# Patient Record
Sex: Male | Born: 1945 | Race: Black or African American | Hispanic: No | Marital: Single | Smoking: Former smoker
Health system: Southern US, Community
[De-identification: ages and names within clinical notes are randomized; demographics above are authoritative.]

## PROBLEM LIST (undated history)

## (undated) DIAGNOSIS — I739 Peripheral vascular disease, unspecified: Secondary | ICD-10-CM

## (undated) DIAGNOSIS — I10 Essential (primary) hypertension: Secondary | ICD-10-CM

## (undated) DIAGNOSIS — N183 Chronic kidney disease, stage 3 (moderate): Secondary | ICD-10-CM

## (undated) DIAGNOSIS — N200 Calculus of kidney: Secondary | ICD-10-CM

## (undated) DIAGNOSIS — M1990 Unspecified osteoarthritis, unspecified site: Secondary | ICD-10-CM

## (undated) DIAGNOSIS — N189 Chronic kidney disease, unspecified: Secondary | ICD-10-CM

## (undated) HISTORY — DX: Essential (primary) hypertension: I10

## (undated) HISTORY — DX: Unspecified osteoarthritis, unspecified site: M19.90

## (undated) HISTORY — DX: Chronic kidney disease, unspecified: N18.9

## (undated) HISTORY — DX: Peripheral vascular disease, unspecified: I73.9

## (undated) HISTORY — DX: Chronic kidney disease, stage 3 (moderate): N18.3

## (undated) HISTORY — PX: NO PAST SURGERIES: SHX2092

## (undated) HISTORY — DX: Calculus of kidney: N20.0

---

## 2013-01-29 ENCOUNTER — Ambulatory Visit: Payer: Self-pay | Admitting: Oncology

## 2013-02-14 ENCOUNTER — Inpatient Hospital Stay: Payer: Self-pay | Admitting: Internal Medicine

## 2013-02-14 LAB — CBC WITH DIFFERENTIAL/PLATELET
Basophil %: 0.2 %
Comment - H1-Com4: NORMAL
Eosinophil #: 0 10*3/uL (ref 0.0–0.7)
Eosinophil #: 0 10*3/uL (ref 0.0–0.7)
HCT: 33.4 % — ABNORMAL LOW (ref 40.0–52.0)
HCT: 28.9 % — ABNORMAL LOW
HGB: 9.4 g/dL — ABNORMAL LOW
HGB: 7.6 g/dL — ABNORMAL LOW (ref 13.0–18.0)
Lymphocyte #: 2.2 10*3/uL (ref 1.0–3.6)
Lymphocyte %: 9.1 %
Lymphocytes: 6 %
MCH: 29.7 pg (ref 26.0–34.0)
MCH: 30 pg
MCH: 30.9 pg (ref 26.0–34.0)
MCHC: 31.8 g/dL — ABNORMAL LOW (ref 32.0–36.0)
MCHC: 32.6 g/dL
MCHC: 33.6 g/dL (ref 32.0–36.0)
MCV: 94 fL (ref 80–100)
MCV: 92 fL
Monocyte #: 0.4 x10 3/mm (ref 0.2–1.0)
Monocyte #: 1.6 x10 3/mm — ABNORMAL HIGH (ref 0.2–1.0)
Monocytes: 1 %
Neutrophil #: 16.1 10*3/uL — ABNORMAL HIGH (ref 1.4–6.5)
Neutrophil #: 20.1 10*3/uL — ABNORMAL HIGH (ref 1.4–6.5)
Neutrophil %: 83.9 %
Platelet: 8 10*3/uL — CL (ref 150–440)
Platelet: 7 x10 3/mm 3 — CL
Platelet: 17 10*3/uL — CL (ref 150–440)
RBC: 3.57 10*6/uL — ABNORMAL LOW (ref 4.40–5.90)
RBC: 3.15 x10 6/mm 3 — ABNORMAL LOW
RBC: 2.47 10*6/uL — ABNORMAL LOW (ref 4.40–5.90)
RDW: 13.1 % (ref 11.5–14.5)
RDW: 13.3 %
RDW: 13.4 % (ref 11.5–14.5)
Segmented Neutrophils: 93 %
WBC: 19.6 10*3/uL — ABNORMAL HIGH (ref 3.8–10.6)
WBC: 21.4 x10 3/mm 3 — ABNORMAL HIGH
WBC: 24 10*3/uL — ABNORMAL HIGH (ref 3.8–10.6)

## 2013-02-14 LAB — URINALYSIS, COMPLETE
Bilirubin,UR: NEGATIVE
Ketone: NEGATIVE
Ph: 6 (ref 4.5–8.0)
RBC,UR: 72415 /HPF
Specific Gravity: 1.018
Squamous Epithelial: NONE SEEN
Squamous Epithelial: NONE SEEN
WBC UR: 324 /HPF
WBC UR: 106 /HPF (ref 0–5)

## 2013-02-14 LAB — COMPREHENSIVE METABOLIC PANEL
Albumin: 3.5 g/dL (ref 3.4–5.0)
Alkaline Phosphatase: 37 U/L — ABNORMAL LOW (ref 50–136)
Anion Gap: 17 — ABNORMAL HIGH (ref 7–16)
Bilirubin,Total: 0.4 mg/dL (ref 0.2–1.0)
Creatinine: 4.04 mg/dL — ABNORMAL HIGH (ref 0.60–1.30)
EGFR (African American): 17 — ABNORMAL LOW
EGFR (Non-African Amer.): 15 — ABNORMAL LOW
Glucose: 269 mg/dL — ABNORMAL HIGH (ref 65–99)
Osmolality: 310 (ref 275–301)
Potassium: 4.2 mmol/L (ref 3.5–5.1)
SGOT(AST): 18 U/L (ref 15–37)
Total Protein: 6.4 g/dL (ref 6.4–8.2)

## 2013-02-14 LAB — PROTIME-INR: INR: 1.1

## 2013-02-14 LAB — APTT: Activated PTT: 29.1 secs (ref 23.6–35.9)

## 2013-02-14 LAB — LACTATE DEHYDROGENASE: LDH: 374 U/L — ABNORMAL HIGH (ref 85–241)

## 2013-02-14 LAB — ETHANOL: Ethanol %: <0.003 % (ref 0.000–0.080)

## 2013-02-14 LAB — FIBRIN DEGRADATION PROD.(ARMC ONLY): Fibrin Degradation Prod.: <10 ug/ml (ref 2.1–7.7)

## 2013-02-14 LAB — POTASSIUM: Potassium: 5 mmol/L

## 2013-02-14 LAB — TROPONIN I: Troponin-I: 0.04 ng/mL

## 2013-02-14 LAB — LIPASE, BLOOD: Lipase: 76 U/L (ref 73–393)

## 2013-02-14 LAB — FIBRINOGEN: Fibrinogen: 343 mg/dL (ref 210–470)

## 2013-02-14 LAB — PLATELET COUNT: Platelet: 8 x10 3/mm 3 — CL

## 2013-02-14 LAB — PROTEIN / CREATININE RATIO, URINE: Protein, Random Urine: 82 mg/dL — ABNORMAL HIGH (ref 0–12)

## 2013-02-15 LAB — CBC WITH DIFFERENTIAL/PLATELET
Basophil #: 0 10*3/uL (ref 0.0–0.1)
Basophil #: 0.1 10*3/uL (ref 0.0–0.1)
Basophil #: 0 10*3/uL (ref 0.0–0.1)
Basophil %: 0.3 %
Basophil %: 0.1 %
Basophil: 1 %
Eosinophil #: 0 10*3/uL (ref 0.0–0.7)
Eosinophil %: 0 %
Eosinophil %: 0.1 %
Eosinophil %: 0 %
HCT: 21.5 % — ABNORMAL LOW (ref 40.0–52.0)
HGB: 8.2 g/dL — ABNORMAL LOW (ref 13.0–18.0)
HGB: 7.3 g/dL — ABNORMAL LOW (ref 13.0–18.0)
Lymphocyte %: 10.1 %
Lymphocyte %: 4 %
Lymphocytes: 10 %
MCH: 30.8 pg (ref 26.0–34.0)
MCH: 30.7 pg (ref 26.0–34.0)
MCV: 92 fL (ref 80–100)
MCV: 90 fL (ref 80–100)
MCV: 90 fL (ref 80–100)
Monocyte #: 1.8 x10 3/mm — ABNORMAL HIGH (ref 0.2–1.0)
Monocyte #: 1.8 x10 3/mm — ABNORMAL HIGH (ref 0.2–1.0)
Neutrophil #: 19.4 10*3/uL — ABNORMAL HIGH (ref 1.4–6.5)
Neutrophil #: 16.4 10*3/uL — ABNORMAL HIGH (ref 1.4–6.5)
Neutrophil %: 82 %
Neutrophil %: 80.2 %
Platelet: 5 10*3/uL — CL (ref 150–440)
Platelet: 8 10*3/uL — CL (ref 150–440)
Platelet: 26 10*3/uL — CL (ref 150–440)
RBC: 2.33 10*6/uL — ABNORMAL LOW (ref 4.40–5.90)
RBC: 2.66 10*6/uL — ABNORMAL LOW (ref 4.40–5.90)
RDW: 12.9 % (ref 11.5–14.5)
RDW: 13.5 % (ref 11.5–14.5)
Segmented Neutrophils: 80 %
WBC: 23.6 10*3/uL — ABNORMAL HIGH (ref 3.8–10.6)
WBC: 20.4 10*3/uL — ABNORMAL HIGH (ref 3.8–10.6)
WBC: 24 10*3/uL — ABNORMAL HIGH (ref 3.8–10.6)

## 2013-02-15 LAB — BASIC METABOLIC PANEL
Anion Gap: 7 (ref 7–16)
BUN: 75 mg/dL — ABNORMAL HIGH (ref 7–18)
Calcium, Total: 7.8 mg/dL — ABNORMAL LOW (ref 8.5–10.1)
EGFR (African American): 26 — ABNORMAL LOW
Glucose: 124 mg/dL — ABNORMAL HIGH (ref 65–99)

## 2013-02-15 LAB — URINE CULTURE

## 2013-02-16 LAB — CBC WITH DIFFERENTIAL/PLATELET
Bands: 2 %
Basophil #: 0 10*3/uL (ref 0.0–0.1)
Basophil #: 0 x10 3/mm 3 (ref 0.0–0.1)
Basophil %: 0.1 %
Basophil %: 0.1 %
Eosinophil #: 0 10*3/uL (ref 0.0–0.7)
Eosinophil #: 0 x10 3/mm 3 (ref 0.0–0.7)
Eosinophil %: 0 %
Eosinophil: 1 %
HCT: 25.1 % — ABNORMAL LOW (ref 40.0–52.0)
HGB: 7.8 g/dL — ABNORMAL LOW (ref 13.0–18.0)
HGB: 8.4 g/dL — ABNORMAL LOW (ref 13.0–18.0)
Lymphocyte #: 1.4 10*3/uL (ref 1.0–3.6)
Lymphocyte %: 4.3 %
Lymphocyte %: 5.7 %
Lymphocytes: 7 %
Lymphs Abs: 1 x10 3/mm 3 (ref 1.0–3.6)
MCH: 29.8 pg (ref 26.0–34.0)
MCHC: 33.5 g/dL (ref 32.0–36.0)
MCV: 89 fL (ref 80–100)
MCV: 89 fL (ref 80–100)
Monocyte #: 0.5 x10 3/mm (ref 0.2–1.0)
Monocyte #: 0.7 x10 3/mm (ref 0.2–1.0)
Monocyte %: 2.3 %
Monocyte %: 2.8 %
Neutrophil #: 21.7 x10 3/mm 3 — ABNORMAL HIGH (ref 1.4–6.5)
Neutrophil #: 22.2 10*3/uL — ABNORMAL HIGH (ref 1.4–6.5)
Neutrophil %: 91.4 %
Neutrophil %: 93.3 %
Platelet: 15 10*3/uL — CL (ref 150–440)
Platelet: 33 x10 3/mm 3 — ABNORMAL LOW (ref 150–440)
RBC: 2.82 x10 6/mm 3 — ABNORMAL LOW (ref 4.40–5.90)
RDW: 14.2 % (ref 11.5–14.5)
RDW: 14.5 % (ref 11.5–14.5)
Segmented Neutrophils: 90 %
WBC: 23.3 x10 3/mm 3 — ABNORMAL HIGH (ref 3.8–10.6)

## 2013-02-16 LAB — COMPREHENSIVE METABOLIC PANEL
Albumin: 3.1 g/dL — ABNORMAL LOW (ref 3.4–5.0)
Co2: 27 mmol/L (ref 21–32)
EGFR (Non-African Amer.): 32 — ABNORMAL LOW
Potassium: 4.1 mmol/L (ref 3.5–5.1)
SGOT(AST): 23 U/L (ref 15–37)
Total Protein: 6.3 g/dL — ABNORMAL LOW (ref 6.4–8.2)

## 2013-02-16 LAB — STOOL CULTURE

## 2013-02-17 LAB — BASIC METABOLIC PANEL
Anion Gap: 9 (ref 7–16)
BUN: 30 mg/dL — ABNORMAL HIGH (ref 7–18)
Calcium, Total: 7.8 mg/dL — ABNORMAL LOW (ref 8.5–10.1)
Chloride: 112 mmol/L — ABNORMAL HIGH (ref 98–107)
Co2: 25 mmol/L (ref 21–32)
Creatinine: 1.7 mg/dL — ABNORMAL HIGH (ref 0.60–1.30)
Glucose: 118 mg/dL — ABNORMAL HIGH (ref 65–99)
Osmolality: 298 (ref 275–301)
Potassium: 3.6 mmol/L (ref 3.5–5.1)
Sodium: 146 mmol/L — ABNORMAL HIGH (ref 136–145)

## 2013-02-17 LAB — CBC WITH DIFFERENTIAL/PLATELET
Basophil #: 0.1 10*3/uL (ref 0.0–0.1)
Eosinophil %: 0 %
HCT: 23.7 % — ABNORMAL LOW (ref 40.0–52.0)
HGB: 7.9 g/dL — ABNORMAL LOW (ref 13.0–18.0)
Lymphocyte %: 5 %
MCH: 29.8 pg (ref 26.0–34.0)
MCHC: 33.1 g/dL (ref 32.0–36.0)
MCV: 90 fL (ref 80–100)
Monocyte #: 1.3 x10 3/mm — ABNORMAL HIGH (ref 0.2–1.0)
Monocyte %: 4.2 %
Neutrophil #: 27.6 10*3/uL — ABNORMAL HIGH (ref 1.4–6.5)
Platelet: 58 10*3/uL — ABNORMAL LOW (ref 150–440)
RDW: 14.4 % (ref 11.5–14.5)
WBC: 30.4 10*3/uL — ABNORMAL HIGH (ref 3.8–10.6)

## 2013-02-17 LAB — VANCOMYCIN, RANDOM: Vancomycin, Random: 4 ug/mL

## 2013-02-18 LAB — BASIC METABOLIC PANEL
Anion Gap: 7 (ref 7–16)
Calcium, Total: 7.7 mg/dL — ABNORMAL LOW (ref 8.5–10.1)
Chloride: 111 mmol/L — ABNORMAL HIGH (ref 98–107)
Co2: 25 mmol/L (ref 21–32)
Creatinine: 1.47 mg/dL — ABNORMAL HIGH (ref 0.60–1.30)
EGFR (Non-African Amer.): 49 — ABNORMAL LOW
Osmolality: 292 (ref 275–301)
Sodium: 143 mmol/L (ref 136–145)

## 2013-02-18 LAB — CBC WITH DIFFERENTIAL/PLATELET
Basophil %: 0 %
Eosinophil #: 0 10*3/uL (ref 0.0–0.7)
Eosinophil %: 0 %
Lymphocyte #: 1.4 10*3/uL (ref 1.0–3.6)
Lymphocyte %: 4.7 %
MCHC: 33.2 g/dL (ref 32.0–36.0)
Monocyte #: 1.2 x10 3/mm — ABNORMAL HIGH (ref 0.2–1.0)
Monocyte %: 4.1 %
Neutrophil #: 27 10*3/uL — ABNORMAL HIGH (ref 1.4–6.5)
Neutrophil %: 91.2 %
Platelet: 118 10*3/uL — ABNORMAL LOW (ref 150–440)
RDW: 14.2 % (ref 11.5–14.5)
WBC: 29.7 10*3/uL — ABNORMAL HIGH (ref 3.8–10.6)

## 2013-02-19 LAB — CBC WITH DIFFERENTIAL/PLATELET
Lymphocytes: 5 %
MCV: 91 fL (ref 80–100)
Platelet: 186 10*3/uL (ref 150–440)
RBC: 2.76 10*6/uL — ABNORMAL LOW (ref 4.40–5.90)
RDW: 14.3 % (ref 11.5–14.5)
Segmented Neutrophils: 92 %

## 2013-02-19 LAB — BASIC METABOLIC PANEL
Anion Gap: 9 (ref 7–16)
BUN: 25 mg/dL — ABNORMAL HIGH (ref 7–18)
Calcium, Total: 7.8 mg/dL — ABNORMAL LOW (ref 8.5–10.1)
Chloride: 109 mmol/L — ABNORMAL HIGH (ref 98–107)
Co2: 24 mmol/L (ref 21–32)
EGFR (African American): 55 — ABNORMAL LOW
Glucose: 132 mg/dL — ABNORMAL HIGH (ref 65–99)
Osmolality: 289 (ref 275–301)
Potassium: 3.7 mmol/L (ref 3.5–5.1)

## 2013-02-19 LAB — CULTURE, BLOOD (SINGLE)

## 2013-02-20 LAB — STOOL CULTURE

## 2013-02-21 ENCOUNTER — Ambulatory Visit: Payer: Self-pay | Admitting: Oncology

## 2013-02-21 LAB — PROTEIN ELECTROPHORESIS(ARMC)

## 2013-02-23 LAB — CBC CANCER CENTER
Comment - H1-Com3: NORMAL
HCT: 31.3 % — ABNORMAL LOW (ref 40.0–52.0)
HGB: 10.4 g/dL — ABNORMAL LOW (ref 13.0–18.0)
Lymphocytes: 16 %
MCH: 30.9 pg (ref 26.0–34.0)
MCHC: 33.3 g/dL (ref 32.0–36.0)
MCV: 93 fL (ref 80–100)
Metamyelocyte: 2 %
Monocytes: 10 %
Myelocyte: 2 %
NRBC/100 WBC: 3 /100
RDW: 15.6 % — ABNORMAL HIGH (ref 11.5–14.5)
Segmented Neutrophils: 69 %

## 2013-02-26 ENCOUNTER — Ambulatory Visit: Payer: Self-pay | Admitting: Oncology

## 2013-03-02 LAB — CBC CANCER CENTER
Basophil #: 0.1 x10 3/mm (ref 0.0–0.1)
Basophil %: 0.5 %
Eosinophil #: 0 x10 3/mm (ref 0.0–0.7)
Eosinophil %: 0.1 %
HCT: 31.1 % — ABNORMAL LOW (ref 40.0–52.0)
Lymphocyte #: 1.1 x10 3/mm (ref 1.0–3.6)
MCH: 30.8 pg (ref 26.0–34.0)
MCHC: 32.8 g/dL (ref 32.0–36.0)
MCV: 94 fL (ref 80–100)
Monocyte #: 0.9 x10 3/mm (ref 0.2–1.0)
Monocyte %: 4.1 %
Platelet: 183 x10 3/mm (ref 150–440)
RBC: 3.31 10*6/uL — ABNORMAL LOW (ref 4.40–5.90)

## 2013-03-09 LAB — CBC CANCER CENTER
Basophil #: 0.1 x10 3/mm (ref 0.0–0.1)
Basophil %: 0.6 %
Eosinophil %: 1.1 %
HGB: 11.7 g/dL — ABNORMAL LOW (ref 13.0–18.0)
MCH: 30.5 pg (ref 26.0–34.0)
MCHC: 31.6 g/dL — ABNORMAL LOW (ref 32.0–36.0)
Neutrophil %: 64.6 %
Platelet: 216 x10 3/mm (ref 150–440)
RBC: 3.82 10*6/uL — ABNORMAL LOW (ref 4.40–5.90)
RDW: 17.3 % — ABNORMAL HIGH (ref 11.5–14.5)

## 2013-03-16 LAB — CBC CANCER CENTER
Basophil #: 0.1 x10 3/mm (ref 0.0–0.1)
Basophil %: 0.9 %
Eosinophil #: 0.1 x10 3/mm (ref 0.0–0.7)
Eosinophil %: 0.8 %
HCT: 44 % (ref 40.0–52.0)
Lymphocyte #: 4.5 x10 3/mm — ABNORMAL HIGH (ref 1.0–3.6)
Lymphocyte %: 32.4 %
MCHC: 32.1 g/dL (ref 32.0–36.0)
MCV: 95 fL (ref 80–100)
Monocyte #: 1 x10 3/mm (ref 0.2–1.0)
Monocyte %: 7.1 %
Neutrophil #: 8.2 x10 3/mm — ABNORMAL HIGH (ref 1.4–6.5)
RBC: 4.64 10*6/uL (ref 4.40–5.90)

## 2013-03-29 ENCOUNTER — Ambulatory Visit: Payer: Self-pay | Admitting: Oncology

## 2013-04-20 LAB — CBC CANCER CENTER
Basophil #: 0.1 x10 3/mm (ref 0.0–0.1)
Eosinophil #: 0.2 x10 3/mm (ref 0.0–0.7)
Eosinophil %: 2.1 %
HCT: 41.4 % (ref 40.0–52.0)
MCH: 29.7 pg (ref 26.0–34.0)
Monocyte #: 0.8 x10 3/mm (ref 0.2–1.0)
Monocyte %: 8.2 %
Neutrophil #: 4.5 x10 3/mm (ref 1.4–6.5)
Neutrophil %: 48.1 %
RBC: 4.53 10*6/uL (ref 4.40–5.90)
RDW: 15 % — ABNORMAL HIGH (ref 11.5–14.5)
WBC: 9.3 x10 3/mm (ref 3.8–10.6)

## 2013-04-28 ENCOUNTER — Ambulatory Visit: Payer: Self-pay | Admitting: Oncology

## 2013-06-01 ENCOUNTER — Ambulatory Visit: Payer: Self-pay | Admitting: Oncology

## 2013-06-01 LAB — CBC CANCER CENTER
Basophil #: 0.1 x10 3/mm (ref 0.0–0.1)
Eosinophil #: 0.2 x10 3/mm (ref 0.0–0.7)
HCT: 40.7 % (ref 40.0–52.0)
HGB: 13.5 g/dL (ref 13.0–18.0)
Lymphocyte #: 3.5 x10 3/mm (ref 1.0–3.6)
Lymphocyte %: 53.7 %
MCH: 29.4 pg (ref 26.0–34.0)
MCHC: 33.1 g/dL (ref 32.0–36.0)
Monocyte #: 0.5 x10 3/mm (ref 0.2–1.0)
Monocyte %: 7.9 %
Neutrophil %: 34.1 %
Platelet: 280 x10 3/mm (ref 150–440)
RBC: 4.59 10*6/uL (ref 4.40–5.90)
WBC: 6.5 x10 3/mm (ref 3.8–10.6)

## 2013-06-28 ENCOUNTER — Ambulatory Visit: Payer: Self-pay | Admitting: Oncology

## 2013-09-21 ENCOUNTER — Ambulatory Visit: Payer: Self-pay | Admitting: Oncology

## 2013-09-21 LAB — CBC CANCER CENTER
Basophil #: 0.1 x10 3/mm (ref 0.0–0.1)
Basophil %: 1.2 %
Eosinophil %: 2.9 %
HCT: 48.5 % (ref 40.0–52.0)
HGB: 15.9 g/dL (ref 13.0–18.0)
Lymphocyte #: 3 x10 3/mm (ref 1.0–3.6)
Lymphocyte %: 48.1 %
MCHC: 32.8 g/dL (ref 32.0–36.0)
MCV: 93 fL (ref 80–100)
Monocyte #: 0.5 x10 3/mm (ref 0.2–1.0)
Monocyte %: 7.5 %
Neutrophil #: 2.5 x10 3/mm (ref 1.4–6.5)
Neutrophil %: 40.3 %
Platelet: 244 x10 3/mm (ref 150–440)
RBC: 5.24 10*6/uL (ref 4.40–5.90)

## 2013-09-28 ENCOUNTER — Ambulatory Visit: Payer: Self-pay | Admitting: Oncology

## 2013-11-16 ENCOUNTER — Ambulatory Visit: Payer: Self-pay | Admitting: Oncology

## 2013-11-16 LAB — CBC CANCER CENTER
Basophil #: 0.1 x10 3/mm (ref 0.0–0.1)
Basophil %: 0.9 %
Eosinophil %: 2 %
HCT: 49.7 % (ref 40.0–52.0)
HGB: 16.3 g/dL (ref 13.0–18.0)
Lymphocyte #: 3 x10 3/mm (ref 1.0–3.6)
MCH: 30.5 pg (ref 26.0–34.0)
MCHC: 32.7 g/dL (ref 32.0–36.0)
MCV: 93 fL (ref 80–100)
Monocyte %: 7 %
Neutrophil #: 4.7 x10 3/mm (ref 1.4–6.5)
Platelet: 3 x10 3/mm — CL (ref 150–440)
RBC: 5.34 10*6/uL (ref 4.40–5.90)
WBC: 8.5 x10 3/mm (ref 3.8–10.6)

## 2013-11-16 LAB — FERRITIN: Ferritin (ARMC): 68 ng/mL (ref 8–388)

## 2013-11-16 LAB — IRON AND TIBC
Iron Saturation: 17 %
Iron: 60 ug/dL — ABNORMAL LOW (ref 65–175)
Unbound Iron-Bind.Cap.: 288 ug/dL

## 2013-11-17 ENCOUNTER — Inpatient Hospital Stay: Payer: Self-pay | Admitting: Oncology

## 2013-11-17 LAB — CBC CANCER CENTER
Basophil %: 0.5 %
Eosinophil #: 0 x10 3/mm (ref 0.0–0.7)
Eosinophil %: 0.1 %
HCT: 43.5 % (ref 40.0–52.0)
HGB: 14.1 g/dL (ref 13.0–18.0)
Lymphocyte %: 10.1 %
MCHC: 32.5 g/dL (ref 32.0–36.0)
Monocyte #: 0.4 x10 3/mm (ref 0.2–1.0)
Monocyte %: 2.9 %
Neutrophil #: 12.1 x10 3/mm — ABNORMAL HIGH (ref 1.4–6.5)
Platelet: 3 x10 3/mm — CL (ref 150–440)
RBC: 4.73 10*6/uL (ref 4.40–5.90)
RDW: 13.7 % (ref 11.5–14.5)
WBC: 14 x10 3/mm — ABNORMAL HIGH (ref 3.8–10.6)

## 2013-11-17 LAB — CREATININE, SERUM
Creatinine: 1.31 mg/dL — ABNORMAL HIGH (ref 0.60–1.30)
EGFR (African American): >60
EGFR (Non-African Amer.): 56 — ABNORMAL LOW

## 2013-11-17 LAB — PLATELET COUNT: Platelet: 4 10*3/uL — CL (ref 150–440)

## 2013-11-18 LAB — CBC WITH DIFFERENTIAL/PLATELET
Basophil #: 0 10*3/uL (ref 0.0–0.1)
Basophil %: 0 %
Eosinophil #: 0 10*3/uL (ref 0.0–0.7)
HCT: 37.3 % — ABNORMAL LOW (ref 40.0–52.0)
HGB: 12.6 g/dL — ABNORMAL LOW (ref 13.0–18.0)
Lymphocyte #: 0.9 10*3/uL — ABNORMAL LOW (ref 1.0–3.6)
MCHC: 33.8 g/dL (ref 32.0–36.0)
MCV: 91 fL (ref 80–100)
Monocyte #: 0.6 x10 3/mm (ref 0.2–1.0)
Neutrophil #: 20.1 10*3/uL — ABNORMAL HIGH (ref 1.4–6.5)
Neutrophil %: 92.9 %
RBC: 4.12 10*6/uL — ABNORMAL LOW (ref 4.40–5.90)
WBC: 21.6 10*3/uL — ABNORMAL HIGH (ref 3.8–10.6)

## 2013-11-18 LAB — PLATELET COUNT: Platelet: 8 10*3/uL — CL (ref 150–440)

## 2013-11-19 LAB — CBC WITH DIFFERENTIAL/PLATELET
Basophil #: 0 10*3/uL (ref 0.0–0.1)
HCT: 36.9 % — ABNORMAL LOW (ref 40.0–52.0)
HGB: 12.3 g/dL — ABNORMAL LOW (ref 13.0–18.0)
Lymphocyte #: 0.8 10*3/uL — ABNORMAL LOW (ref 1.0–3.6)
MCV: 91 fL (ref 80–100)
Monocyte #: 0.8 x10 3/mm (ref 0.2–1.0)
Platelet: 10 10*3/uL — CL (ref 150–440)

## 2013-11-20 LAB — CBC WITH DIFFERENTIAL/PLATELET
Basophil %: 0.1 %
Eosinophil %: 0 %
Lymphocyte %: 6.1 %
MCH: 30.2 pg (ref 26.0–34.0)
MCHC: 33.7 g/dL (ref 32.0–36.0)
MCV: 90 fL (ref 80–100)
Neutrophil %: 90.9 %
Platelet: 13 10*3/uL — CL (ref 150–440)
RBC: 4.64 10*6/uL (ref 4.40–5.90)
RDW: 13.4 % (ref 11.5–14.5)

## 2013-11-21 ENCOUNTER — Ambulatory Visit: Payer: Self-pay | Admitting: Oncology

## 2013-11-22 LAB — CBC CANCER CENTER
Basophil %: 0.6 %
HCT: 42.4 % (ref 40.0–52.0)
HGB: 14.1 g/dL (ref 13.0–18.0)
MCHC: 33.3 g/dL (ref 32.0–36.0)
MCV: 92 fL (ref 80–100)
RBC: 4.62 10*6/uL (ref 4.40–5.90)
WBC: 13.7 x10 3/mm — ABNORMAL HIGH (ref 3.8–10.6)

## 2013-11-28 ENCOUNTER — Ambulatory Visit: Payer: Self-pay | Admitting: Oncology

## 2013-12-07 LAB — CBC CANCER CENTER
Basophil #: 0 x10 3/mm (ref 0.0–0.1)
Basophil %: 0.2 %
Eosinophil #: 0 x10 3/mm (ref 0.0–0.7)
Eosinophil %: 0 %
HGB: 14.4 g/dL (ref 13.0–18.0)
Lymphocyte #: 0.9 x10 3/mm — ABNORMAL LOW (ref 1.0–3.6)
MCHC: 32.8 g/dL (ref 32.0–36.0)
MCV: 94 fL (ref 80–100)
Monocyte #: 0.7 x10 3/mm (ref 0.2–1.0)
Monocyte %: 3.2 %
Neutrophil #: 19.7 x10 3/mm — ABNORMAL HIGH (ref 1.4–6.5)
WBC: 21.3 x10 3/mm — ABNORMAL HIGH (ref 3.8–10.6)

## 2013-12-11 IMAGING — CR DG CHEST 1V PORT
1 series · 1 of 1 positions shown · non-contrast
Comparison: none

REASON FOR EXAM: hematemesis
COMMENTS:

PROCEDURE:     DXR - DXR PORTABLE CHEST SINGLE VIEW  - February 14, 2013  [DATE]
RESULT:     The lungs are clear. The cardiac silhouette and visualized bony
skeleton are unremarkable.

[portable]
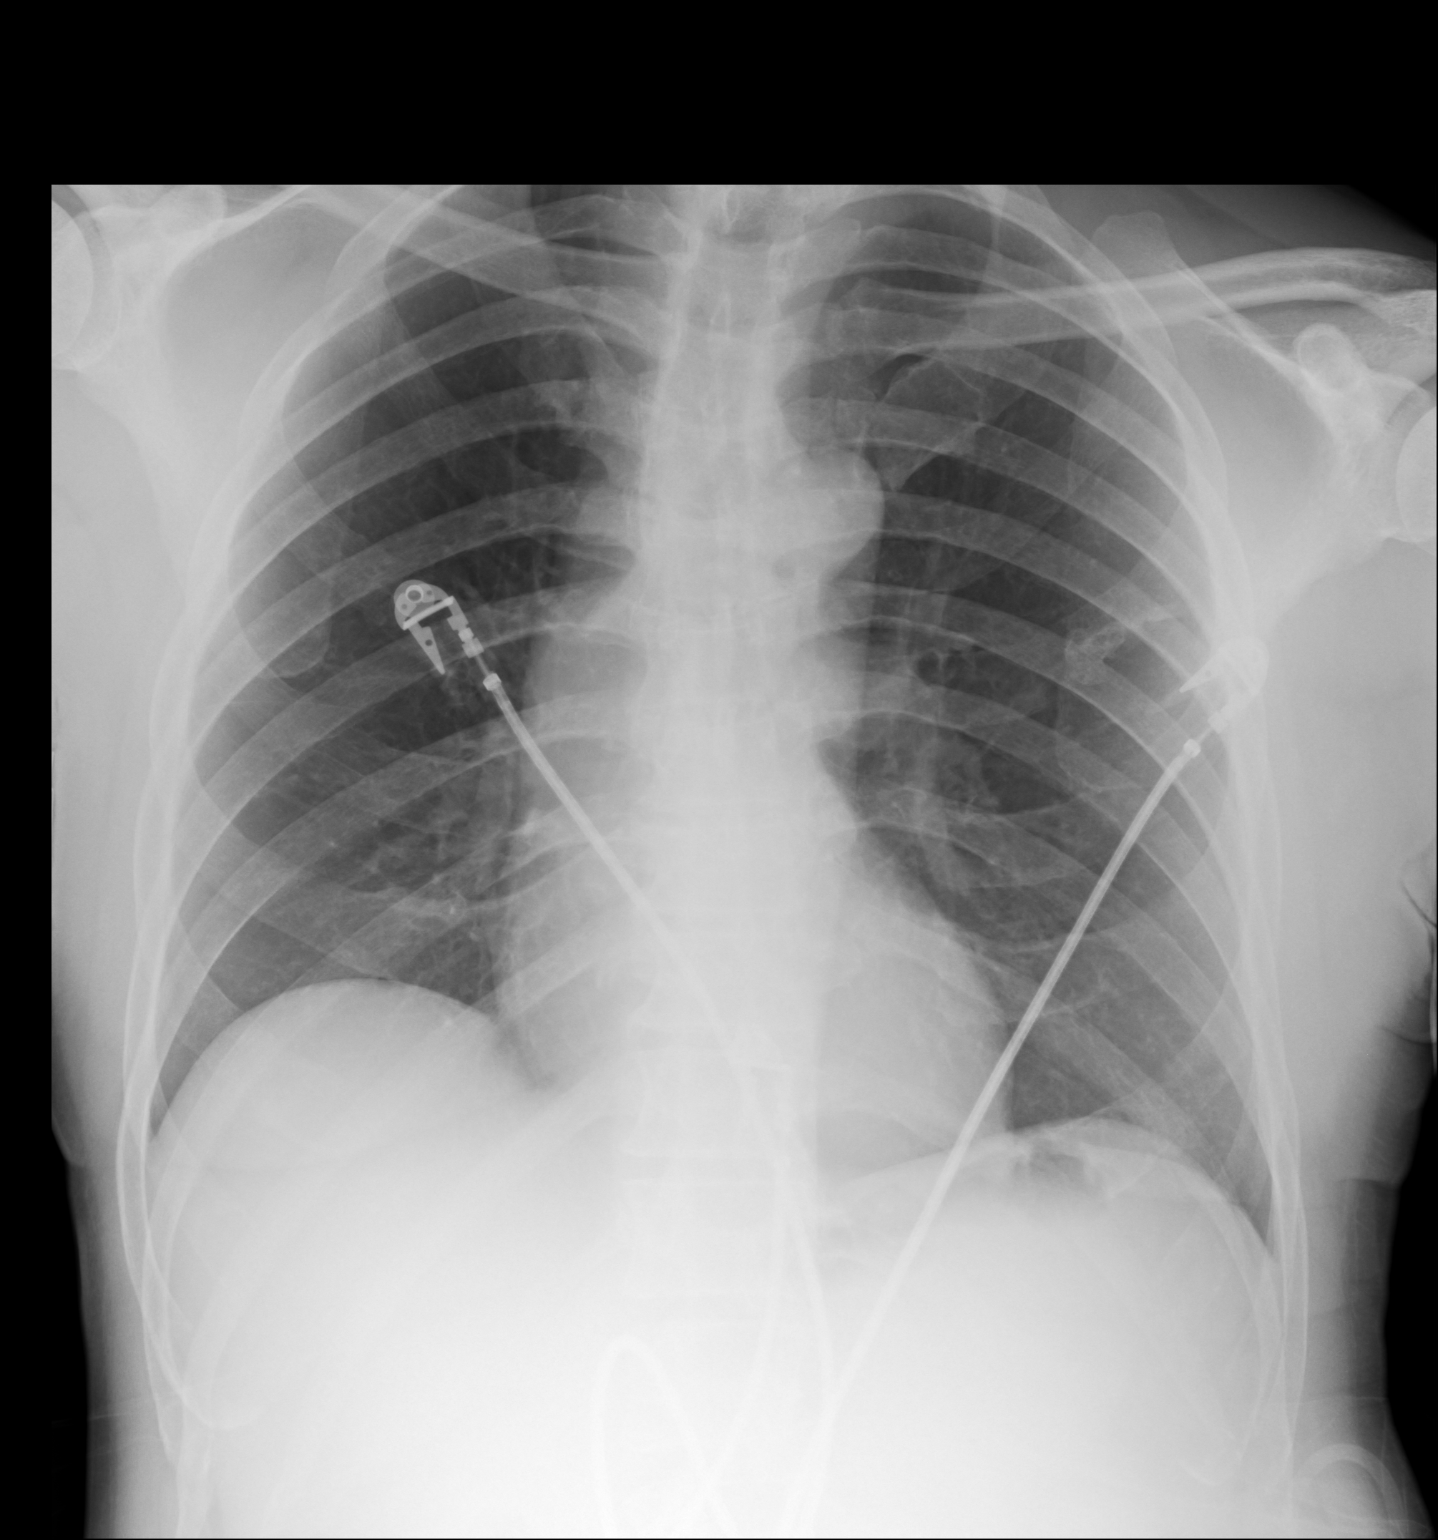

[1 of 1 positions shown; findings below may reference images not displayed]

IMPRESSION: 1. Chest radiograph without evidence of acute cardiopulmonary disease.

## 2013-12-11 IMAGING — US ABDOMEN ULTRASOUND
1 series · 13 of 25 positions shown · non-contrast
Comparison: none

REASON FOR EXAM: please evaluate bladder and prostate if possible.
COMMENTS:   May transport without cardiac monitor

[Series 1: abdomen ultrasound · 0.21mm/px · 13 of 95 slices shown]
[im 1/95]
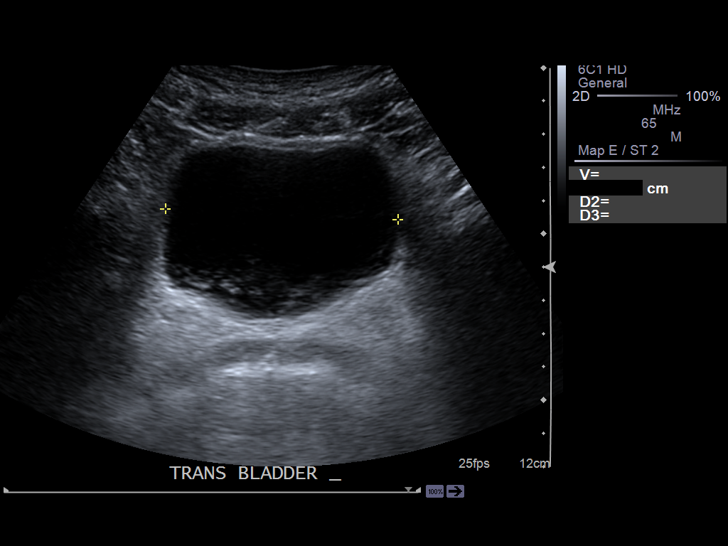
[im 8/95]
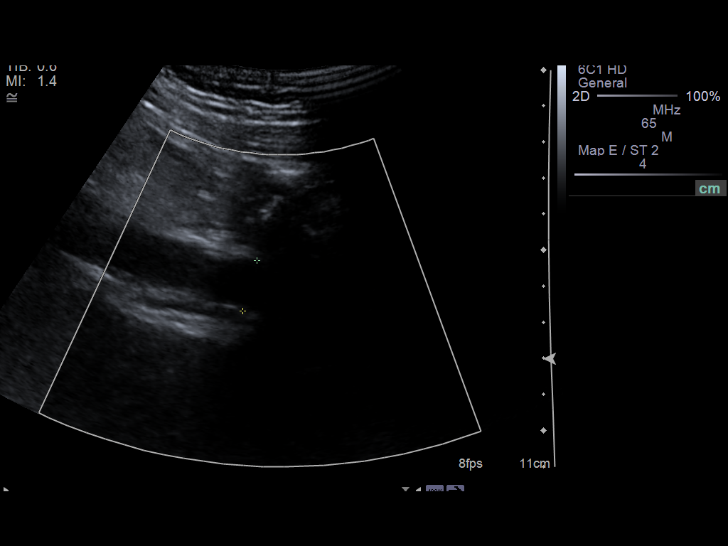
[im 16/95]
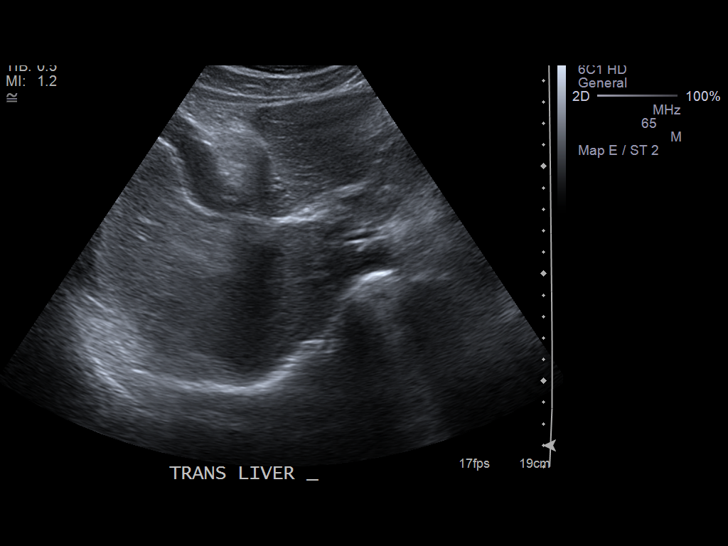
[im 24/95]
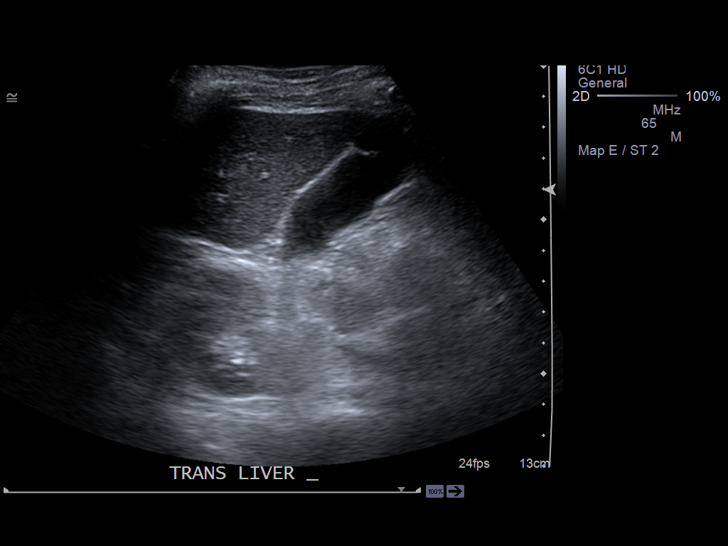
[im 32/95]
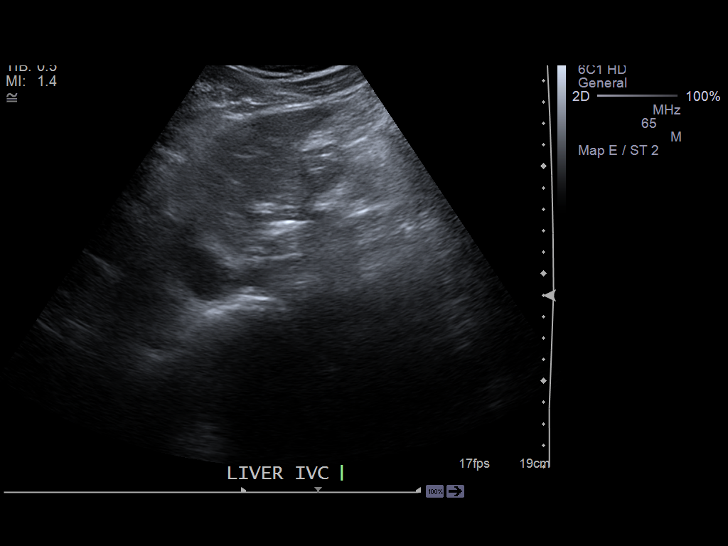
[im 40/95]
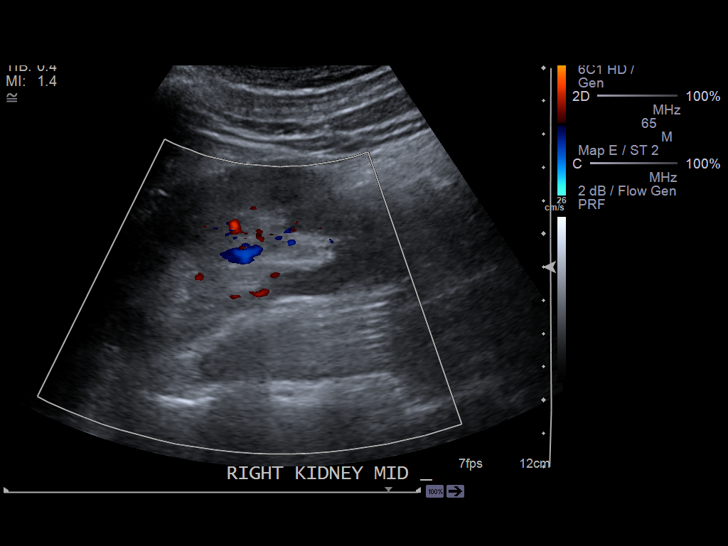
[im 48/95]
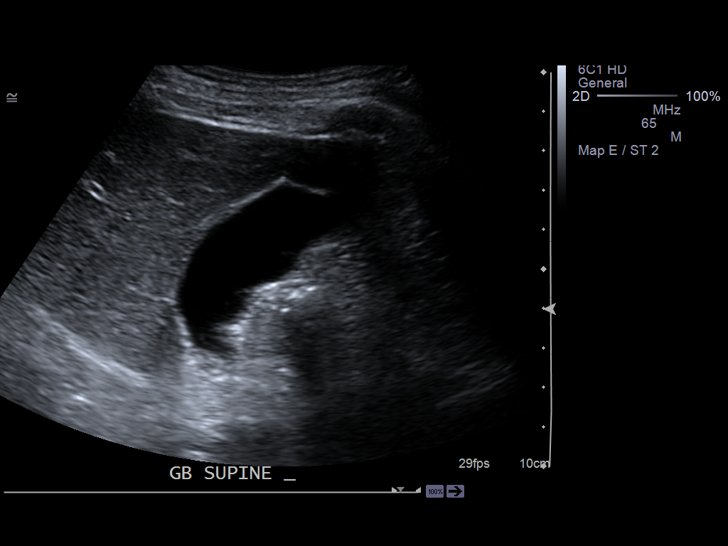
[im 55/95]
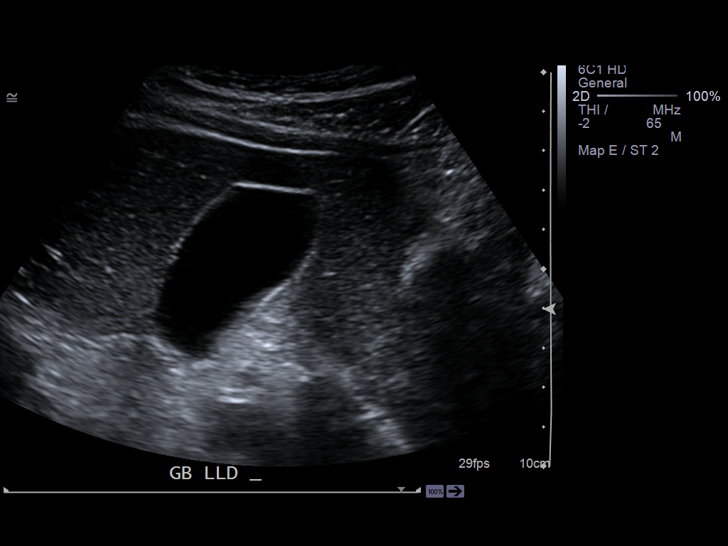
[im 63/95]
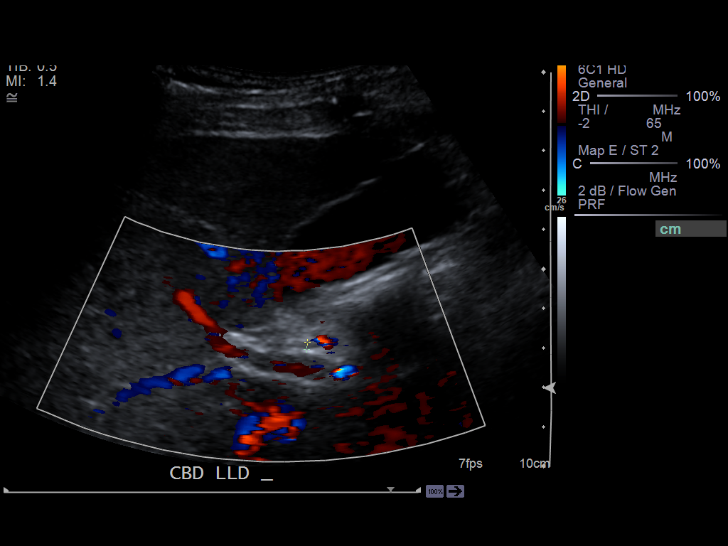
[im 71/95]
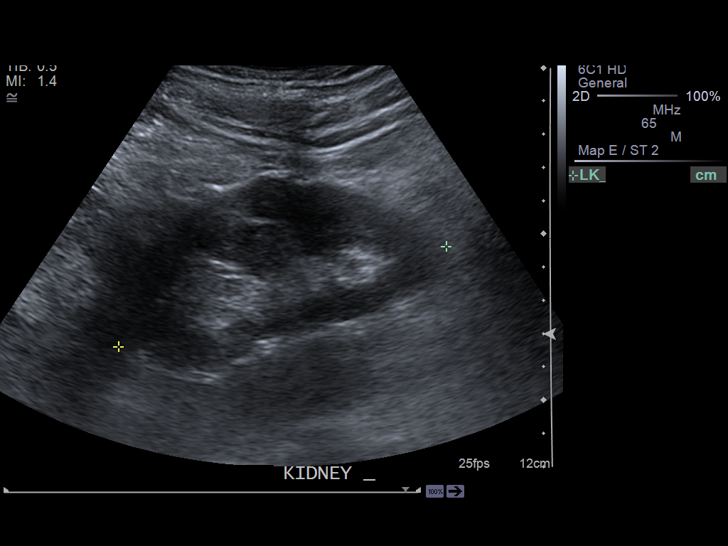
[im 79/95]
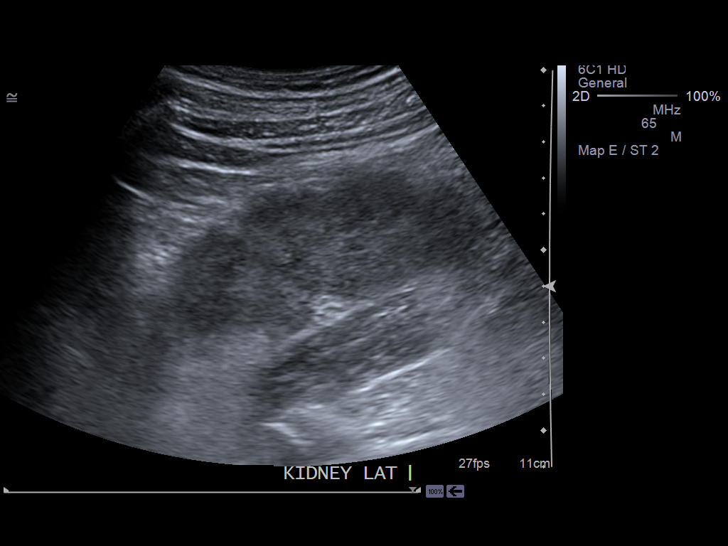
[im 87/95]
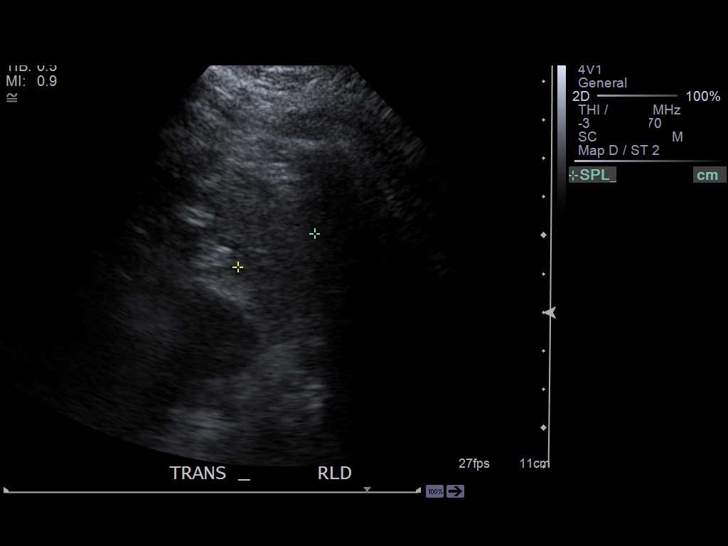
[im 95/95]
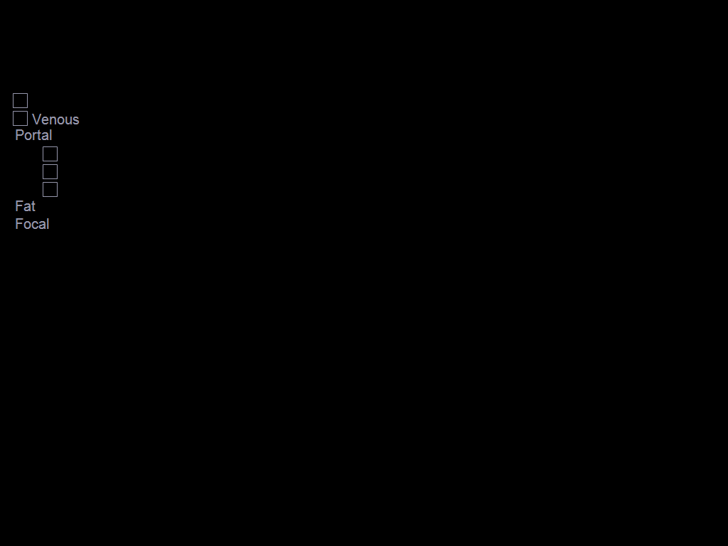

[13 of 25 positions shown; findings below may reference images not displayed]

PROCEDURE:     US  - US ABDOMEN GENERAL SURVEY  - February 14, 2013  [DATE]

RESULT:     Assessment of the urinary bladder shows a volume of 141.1 cc
with some debris layering in the dependent portion. The prostate is enlarged
measuring 6.17 x 3.70 x 5.18 cm. Abdominal aorta tapers normally. The
included pancreas appears to be unremarkable. There is no ductal dilation or
discrete mass. The portal venous flow is normal. The gallbladder shows wall
thickness of 2.0 mm. There is no evidence of cholelithiasis or acute
cholecystitis. The sonographic Murphy's sign is negative. The spleen and
kidneys are normal in size and echotexture. The right kidney measures
x 4.33 x 4.32 cm. The left kidney measures 10.45 x 5.99 x 4.18 cm. There is
a small hypoechoic lesion in the mid right kidney measuring up to 10.3 mm.
Of the inferior vena cava proximally is patent. The spleen shows a normal
echotexture.
IMPRESSION: 1. Prostatic enlargement with debris in the urinary bladder areas
2. No evidence of cholelithiasis or acute cholecystitis. Left renal cyst.
Otherwise unremarkable exam.

[REDACTED]

## 2013-12-28 LAB — CBC CANCER CENTER
Basophil %: 0.8 %
Eosinophil #: 0 x10 3/mm (ref 0.0–0.7)
Eosinophil %: 0.2 %
HCT: 50.1 % (ref 40.0–52.0)
HGB: 16.4 g/dL (ref 13.0–18.0)
Lymphocyte #: 1.9 x10 3/mm (ref 1.0–3.6)
MCV: 95 fL (ref 80–100)
Monocyte #: 0.7 x10 3/mm (ref 0.2–1.0)
Platelet: 250 x10 3/mm (ref 150–440)
RBC: 5.27 10*6/uL (ref 4.40–5.90)
RDW: 15.4 % — ABNORMAL HIGH (ref 11.5–14.5)
WBC: 13.4 x10 3/mm — ABNORMAL HIGH (ref 3.8–10.6)

## 2013-12-29 ENCOUNTER — Ambulatory Visit: Payer: Self-pay | Admitting: Oncology

## 2014-01-12 ENCOUNTER — Inpatient Hospital Stay: Payer: Self-pay | Admitting: Internal Medicine

## 2014-01-12 LAB — CBC
HCT: 47.4 % (ref 40.0–52.0)
HGB: 15.9 g/dL (ref 13.0–18.0)
MCH: 30.7 pg (ref 26.0–34.0)
MCHC: 33.6 g/dL (ref 32.0–36.0)
MCV: 91 fL (ref 80–100)
Platelet: 377 10*3/uL (ref 150–440)
RBC: 5.19 10*6/uL (ref 4.40–5.90)
RDW: 14 % (ref 11.5–14.5)
WBC: 25.2 10*3/uL — ABNORMAL HIGH (ref 3.8–10.6)

## 2014-01-12 LAB — BASIC METABOLIC PANEL
ANION GAP: 9 (ref 7–16)
BUN: 29 mg/dL — AB (ref 7–18)
CALCIUM: 9.6 mg/dL (ref 8.5–10.1)
CO2: 23 mmol/L (ref 21–32)
CREATININE: 2.33 mg/dL — AB (ref 0.60–1.30)
Chloride: 100 mmol/L (ref 98–107)
EGFR (African American): 33 — ABNORMAL LOW
GFR CALC NON AF AMER: 28 — AB
GLUCOSE: 147 mg/dL — AB (ref 65–99)
OSMOLALITY: 273 (ref 275–301)
Potassium: 4.8 mmol/L (ref 3.5–5.1)
Sodium: 132 mmol/L — ABNORMAL LOW (ref 136–145)

## 2014-01-12 LAB — TROPONIN I: Troponin-I: 0.04 ng/mL

## 2014-01-12 LAB — PRO B NATRIURETIC PEPTIDE: B-TYPE NATIURETIC PEPTID: 1027 pg/mL — AB (ref 0–125)

## 2014-01-13 LAB — RAPID INFLUENZA A&B ANTIGENS

## 2014-01-13 LAB — CBC WITH DIFFERENTIAL/PLATELET
Basophil #: 0.2 10*3/uL — ABNORMAL HIGH (ref 0.0–0.1)
Basophil %: 0.6 %
Eosinophil #: 0 10*3/uL (ref 0.0–0.7)
Eosinophil %: 0 %
HCT: 40.4 % (ref 40.0–52.0)
HGB: 13.7 g/dL (ref 13.0–18.0)
LYMPHS ABS: 2.4 10*3/uL (ref 1.0–3.6)
Lymphocyte %: 9.9 %
MCH: 31.3 pg (ref 26.0–34.0)
MCHC: 34 g/dL (ref 32.0–36.0)
MCV: 92 fL (ref 80–100)
MONO ABS: 2.6 x10 3/mm — AB (ref 0.2–1.0)
Monocyte %: 10.4 %
NEUTROS PCT: 79.1 %
Neutrophil #: 19.4 10*3/uL — ABNORMAL HIGH (ref 1.4–6.5)
PLATELETS: 320 10*3/uL (ref 150–440)
RBC: 4.38 10*6/uL — AB (ref 4.40–5.90)
RDW: 14.5 % (ref 11.5–14.5)
WBC: 24.2 10*3/uL — ABNORMAL HIGH (ref 3.8–10.6)

## 2014-01-13 LAB — BASIC METABOLIC PANEL
Anion Gap: 6 — ABNORMAL LOW (ref 7–16)
BUN: 29 mg/dL — ABNORMAL HIGH (ref 7–18)
Calcium, Total: 8.3 mg/dL — ABNORMAL LOW (ref 8.5–10.1)
Chloride: 103 mmol/L (ref 98–107)
Co2: 26 mmol/L (ref 21–32)
Creatinine: 2.04 mg/dL — ABNORMAL HIGH (ref 0.60–1.30)
GFR CALC AF AMER: 38 — AB
GFR CALC NON AF AMER: 33 — AB
Glucose: 104 mg/dL — ABNORMAL HIGH (ref 65–99)
Osmolality: 276 (ref 275–301)
Potassium: 4.5 mmol/L (ref 3.5–5.1)
Sodium: 135 mmol/L — ABNORMAL LOW (ref 136–145)

## 2014-01-13 LAB — URINALYSIS, COMPLETE
BLOOD: NEGATIVE
Bilirubin,UR: NEGATIVE
Glucose,UR: NEGATIVE mg/dL (ref 0–75)
Ketone: NEGATIVE
Nitrite: NEGATIVE
PROTEIN: NEGATIVE
Ph: 5 (ref 4.5–8.0)
Specific Gravity: 1.017 (ref 1.003–1.030)

## 2014-01-14 LAB — CBC WITH DIFFERENTIAL/PLATELET
BASOS ABS: 0.1 10*3/uL (ref 0.0–0.1)
Basophil %: 0.5 %
Eosinophil #: 0 10*3/uL (ref 0.0–0.7)
Eosinophil %: 0 %
HCT: 39.8 % — ABNORMAL LOW (ref 40.0–52.0)
HGB: 13.4 g/dL (ref 13.0–18.0)
Lymphocyte #: 2 10*3/uL (ref 1.0–3.6)
Lymphocyte %: 8.9 %
MCH: 30.8 pg (ref 26.0–34.0)
MCHC: 33.7 g/dL (ref 32.0–36.0)
MCV: 91 fL (ref 80–100)
MONO ABS: 1.7 x10 3/mm — AB (ref 0.2–1.0)
Monocyte %: 7.9 %
Neutrophil #: 18.2 10*3/uL — ABNORMAL HIGH (ref 1.4–6.5)
Neutrophil %: 82.7 %
Platelet: 349 10*3/uL (ref 150–440)
RBC: 4.35 10*6/uL — AB (ref 4.40–5.90)
RDW: 14.8 % — AB (ref 11.5–14.5)
WBC: 22 10*3/uL — AB (ref 3.8–10.6)

## 2014-01-14 LAB — BASIC METABOLIC PANEL
Anion Gap: 9 (ref 7–16)
BUN: 27 mg/dL — ABNORMAL HIGH (ref 7–18)
CREATININE: 2.19 mg/dL — AB (ref 0.60–1.30)
Calcium, Total: 8.3 mg/dL — ABNORMAL LOW (ref 8.5–10.1)
Chloride: 105 mmol/L (ref 98–107)
Co2: 24 mmol/L (ref 21–32)
EGFR (African American): 35 — ABNORMAL LOW
EGFR (Non-African Amer.): 30 — ABNORMAL LOW
Glucose: 116 mg/dL — ABNORMAL HIGH (ref 65–99)
Osmolality: 282 (ref 275–301)
POTASSIUM: 4.2 mmol/L (ref 3.5–5.1)
SODIUM: 138 mmol/L (ref 136–145)

## 2014-01-14 LAB — URINE CULTURE

## 2014-01-15 LAB — CBC WITH DIFFERENTIAL/PLATELET
BASOS ABS: 0.1 10*3/uL (ref 0.0–0.1)
BASOS PCT: 0.3 %
EOS ABS: 0 10*3/uL (ref 0.0–0.7)
Eosinophil %: 0.2 %
HCT: 39.1 % — AB (ref 40.0–52.0)
HGB: 13.2 g/dL (ref 13.0–18.0)
Lymphocyte #: 2 10*3/uL (ref 1.0–3.6)
Lymphocyte %: 11.9 %
MCH: 31.2 pg (ref 26.0–34.0)
MCHC: 33.9 g/dL (ref 32.0–36.0)
MCV: 92 fL (ref 80–100)
MONO ABS: 1.5 x10 3/mm — AB (ref 0.2–1.0)
MONOS PCT: 8.9 %
NEUTROS ABS: 13.2 10*3/uL — AB (ref 1.4–6.5)
NEUTROS PCT: 78.7 %
PLATELETS: 408 10*3/uL (ref 150–440)
RBC: 4.25 10*6/uL — ABNORMAL LOW (ref 4.40–5.90)
RDW: 14.9 % — AB (ref 11.5–14.5)
WBC: 16.8 10*3/uL — ABNORMAL HIGH (ref 3.8–10.6)

## 2014-01-15 LAB — BASIC METABOLIC PANEL
Anion Gap: 4 — ABNORMAL LOW (ref 7–16)
BUN: 17 mg/dL (ref 7–18)
CO2: 26 mmol/L (ref 21–32)
Calcium, Total: 8.4 mg/dL — ABNORMAL LOW (ref 8.5–10.1)
Chloride: 104 mmol/L (ref 98–107)
Creatinine: 1.87 mg/dL — ABNORMAL HIGH (ref 0.60–1.30)
EGFR (African American): 42 — ABNORMAL LOW
EGFR (Non-African Amer.): 37 — ABNORMAL LOW
Glucose: 112 mg/dL — ABNORMAL HIGH (ref 65–99)
Osmolality: 271 (ref 275–301)
POTASSIUM: 3.8 mmol/L (ref 3.5–5.1)
Sodium: 134 mmol/L — ABNORMAL LOW (ref 136–145)

## 2014-01-17 LAB — CULTURE, BLOOD (SINGLE)

## 2014-02-01 ENCOUNTER — Ambulatory Visit: Payer: Self-pay | Admitting: Oncology

## 2014-02-01 LAB — CBC CANCER CENTER
BASOS ABS: 0.1 x10 3/mm (ref 0.0–0.1)
Basophil %: 0.9 %
EOS PCT: 4.2 %
Eosinophil #: 0.4 x10 3/mm (ref 0.0–0.7)
HCT: 39.3 % — AB (ref 40.0–52.0)
HGB: 13 g/dL (ref 13.0–18.0)
LYMPHS ABS: 3.1 x10 3/mm (ref 1.0–3.6)
LYMPHS PCT: 31.9 %
MCH: 30.4 pg (ref 26.0–34.0)
MCHC: 33 g/dL (ref 32.0–36.0)
MCV: 92 fL (ref 80–100)
MONO ABS: 0.9 x10 3/mm (ref 0.2–1.0)
Monocyte %: 8.9 %
Neutrophil #: 5.2 x10 3/mm (ref 1.4–6.5)
Neutrophil %: 54.1 %
Platelet: 471 x10 3/mm — ABNORMAL HIGH (ref 150–440)
RBC: 4.27 10*6/uL — ABNORMAL LOW (ref 4.40–5.90)
RDW: 14.3 % (ref 11.5–14.5)
WBC: 9.7 x10 3/mm (ref 3.8–10.6)

## 2014-02-26 ENCOUNTER — Ambulatory Visit: Payer: Self-pay | Admitting: Oncology

## 2014-03-01 LAB — CBC CANCER CENTER
Basophil #: 0.2 x10 3/mm — ABNORMAL HIGH (ref 0.0–0.1)
Basophil %: 2.6 %
Eosinophil #: 0.2 x10 3/mm (ref 0.0–0.7)
Eosinophil %: 2.8 %
HCT: 43.7 % (ref 40.0–52.0)
HGB: 14.4 g/dL (ref 13.0–18.0)
LYMPHS ABS: 2.7 x10 3/mm (ref 1.0–3.6)
Lymphocyte %: 41.9 %
MCH: 30.5 pg (ref 26.0–34.0)
MCHC: 32.9 g/dL (ref 32.0–36.0)
MCV: 93 fL (ref 80–100)
MONO ABS: 0.6 x10 3/mm (ref 0.2–1.0)
Monocyte %: 9.3 %
NEUTROS PCT: 43.4 %
Neutrophil #: 2.8 x10 3/mm (ref 1.4–6.5)
Platelet: 272 x10 3/mm (ref 150–440)
RBC: 4.71 10*6/uL (ref 4.40–5.90)
RDW: 14.6 % — AB (ref 11.5–14.5)
WBC: 6.4 x10 3/mm (ref 3.8–10.6)

## 2014-03-29 ENCOUNTER — Ambulatory Visit: Payer: Self-pay | Admitting: Oncology

## 2014-06-07 ENCOUNTER — Ambulatory Visit: Payer: Self-pay | Admitting: Oncology

## 2014-11-08 IMAGING — CR DG CHEST 2V
1 series · 2 of 2 positions shown · non-contrast
Comparison: Single view of the chest 02/14/2013.

CLINICAL DATA: Severe right side pain.

EXAM:
CHEST  2 VIEW

[Series 1: w chest pa · 0.14mm/px · 2 of 2 slices shown]
[im 1/2]
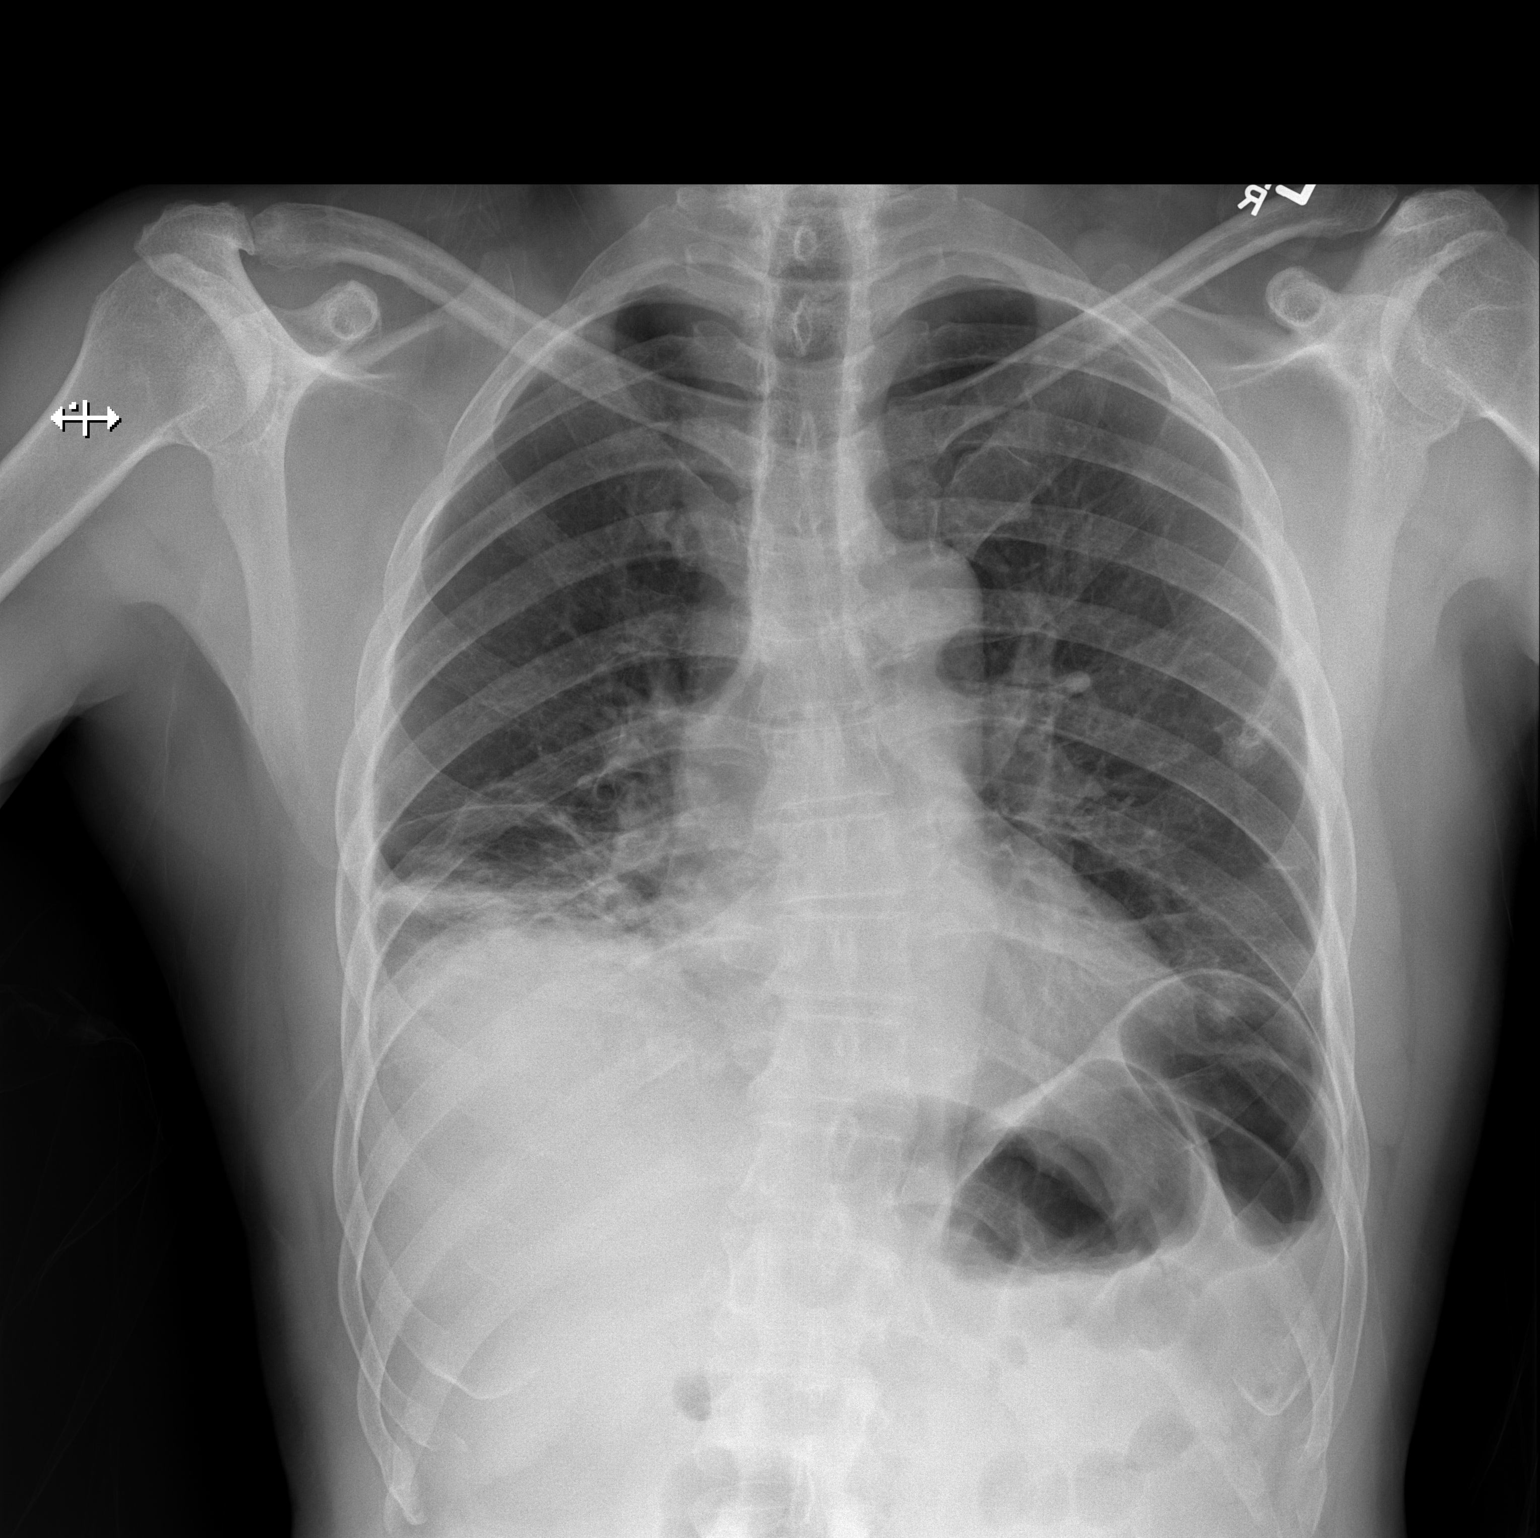
[im 2/2]
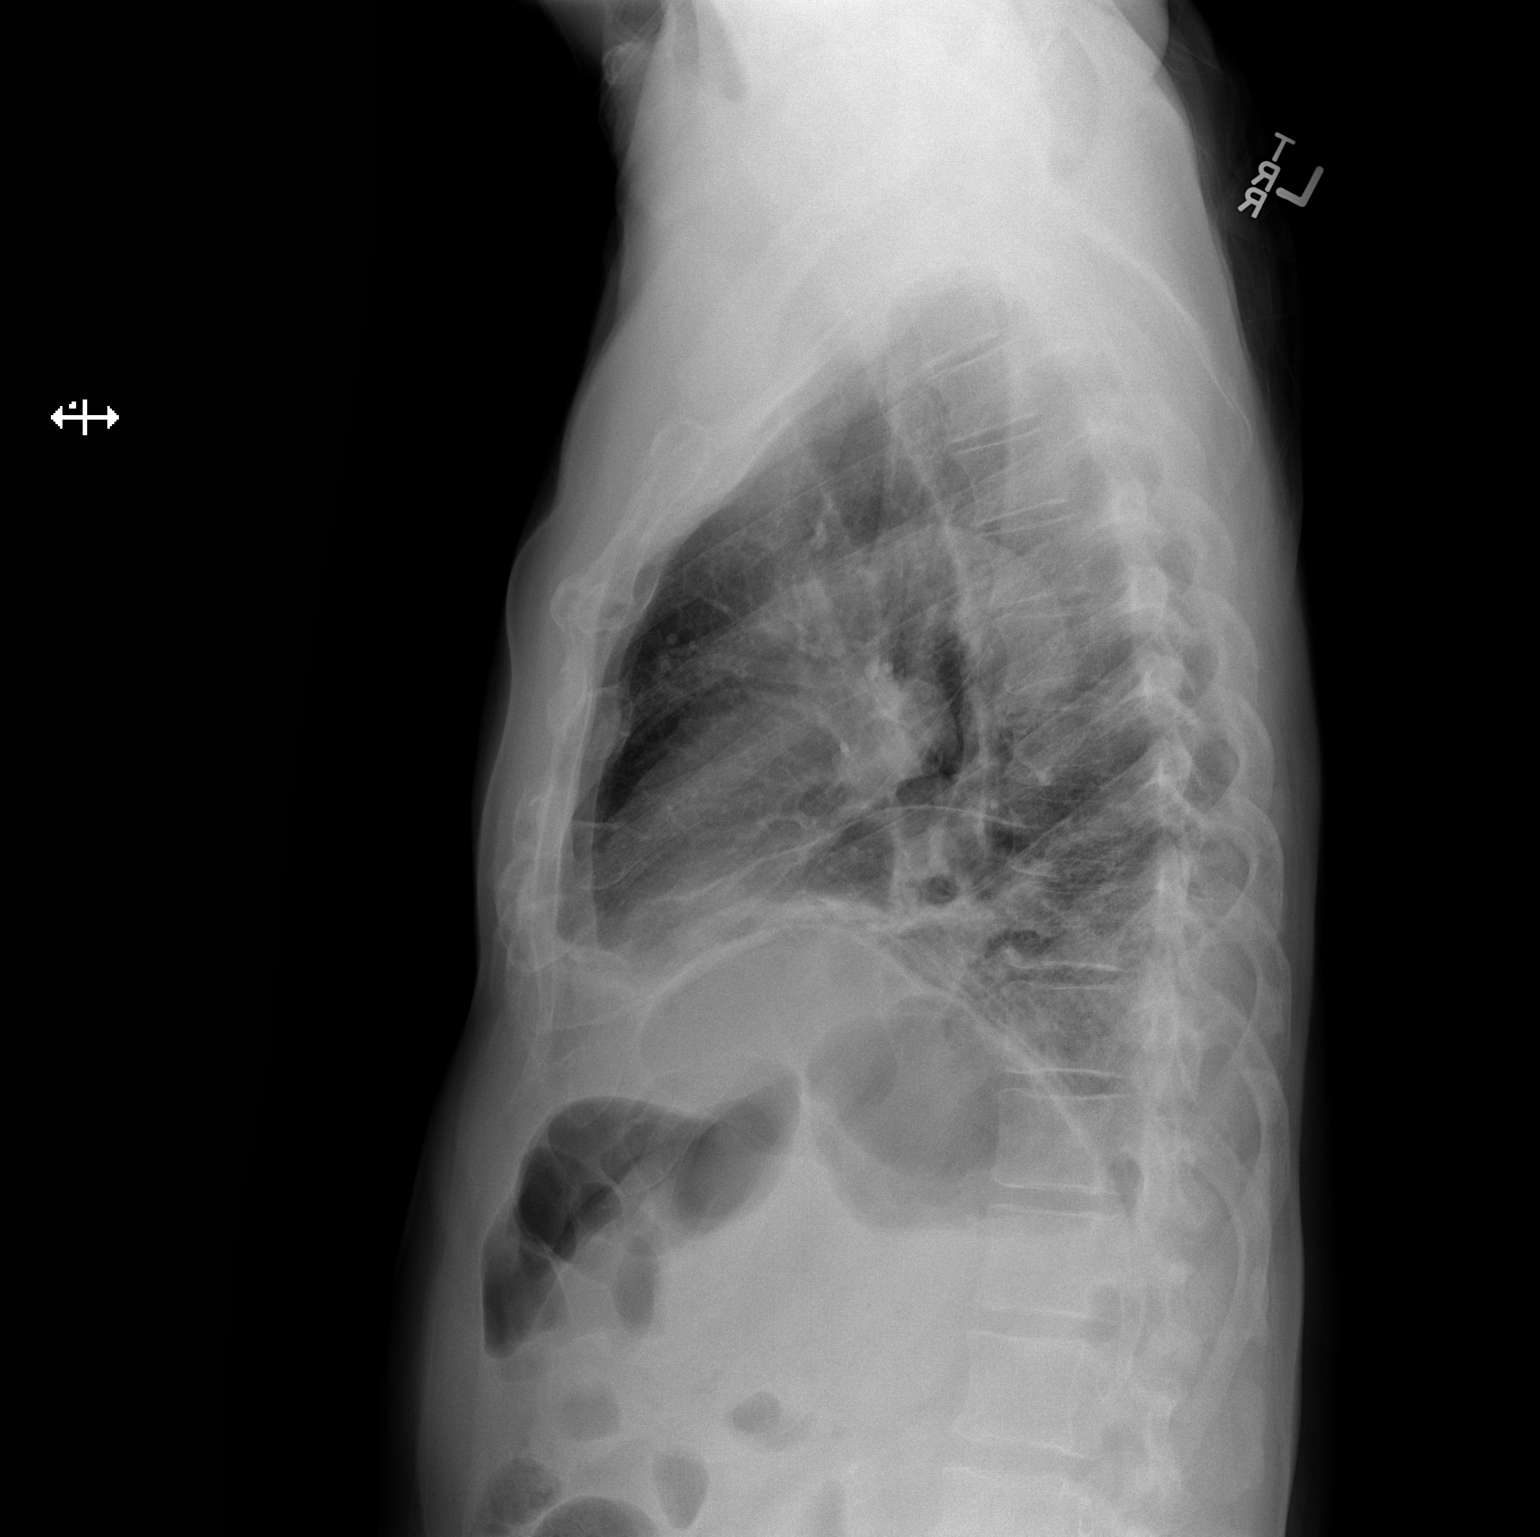

[2 of 2 positions shown; findings below may reference images not displayed]

FINDINGS: There is bibasilar airspace disease which is much worse on the
right. No pneumothorax is identified. Small right pleural effusion
is noted.
IMPRESSION: Right much worse than left basilar airspace disease most consistent
with pneumonia. Recommend followup plain films to clearing.

## 2015-04-20 NOTE — Consult Note (Signed)
PATIENT NAME:  Albert Villarreal, Albert Villarreal MR#:  [id number] DATE OF BIRTH:  07-24-1946  DATE OF CONSULTATION:  02/14/2013  REFERRING PHYSICIAN: Dr. Laurin Coder CONSULTING PHYSICIAN:  Nawal Burling Lilian Kapur, MD  REASON FOR CONSULTATION: Acute renal failure.   HISTORY OF PRESENT ILLNESS: The patient is a very pleasant 69 year old African American male with past medical history of hypertension, history of alcohol abuse, but quit alcohol 2 years ago, and tobacco abuse, who presented to [facility] with hematemesis and hematuria. Several days ago, the patient reports that he was having pain in both feet and began taking ibuprofen. He stated that he was taking ibuprofen two 200 mg tablets daily. Thereafter, he began developing some hematuria. After he developed hematuria, he began experiencing hematemesis. At that point in time, he decided to come in for evaluation. Upon presentation, the patient was found to have severe acute renal failure with a creatinine of 4.04. He denies prior history of renal insufficiency. He was also found to have severely low platelets of 8000. This was repeated and came back the same period. He also had an elevated serum LDH. I subsequently discussed the case with Dr. Grayland Ormond, who did not see evidence for schistocytes. D-dimer was a bit elevated at 3.79. Fibrinogen was found to be 343. PT and INR were found to be normal. The patient had significant amount of hematuria with RBCs of 72,415 per high-power field. He has a Foley catheter now in place and has significant blood in the Foley bag. Hemoglobin is also a bit low at 10.6. He denies having had periods of fever or confusion. Temperature currently is 98.1. Upon presentation to [facility], his temperature was 94.   PAST MEDICAL HISTORY:  1.  Hypertension.  2.  History of alcohol abuse 2 years ago.  3.  Tobacco abuse.   PAST SURGICAL HISTORY: None.   HOME MEDICATIONS:  1.  Ibuprofen 2 to 3 tablets a day for  the last 2 days.  2.  Hydrochlorothiazide/lisinopril 12.5/20 mg p.o. daily.   SOCIAL HISTORY: The patient lives alone in [place]. He smokes 2 packs per day of cigarettes. He had history of alcohol abuse in the past, but quit 2 years ago. No illicit drug use.   FAMILY HISTORY: The patient states that his mother was unfortunately burned to death. His father died of a stroke.   REVIEW OF SYSTEMS:  CONSTITUTIONAL: The patient specifically denies having had fevers. He did not have fever upon presentation here. EYES: Denies diplopia or blurry vision.  HEENT: Denies headaches, hearing loss, tinnitus or epistaxis. He did have bleeding from his gums, however.  CARDIOVASCULAR: Denies chest pain, palpitations or PND.  RESPIRATORY: Denies cough, shortness of breath or hemoptysis.  GASTROINTESTINAL: Reports hematemesis.  GENITOURINARY: Reports significant amounts of hematuria.  MUSCULOSKELETAL: Denies joint pain now, but was having significant foot pain prior to admission.  INTEGUMENTARY: Denies skin rashes or any purpura.  NEUROLOGIC: Denies focal extremity numbness, weakness or tingling.  PSYCHIATRIC: Denies depression or bipolar disorder.  ENDOCRINE: Denies polyuria, polydipsia or polyphagia.  HEMATOLOGIC/LYMPHATIC: Presented with severe thrombocytopenia. ALLERGY/IMMUNOLOGIC: Denies seasonal allergies or history of immunodeficiency.   PHYSICAL EXAMINATION:  VITAL SIGNS: Temperature 98.1, pulse 122, respirations 28, blood pressure 137/57, pulse oximetry 96% on 1 L.  GENERAL: Reveals thin African American male, who appears his stated age, currently in no acute distress.  HEENT: Normocephalic, atraumatic. Extraocular movements are intact. Pupils are equal, round, and reactive to light. No scleral icterus. The conjunctivae are pink. No  epistaxis noted. Gross hearing intact. Oral mucosa moist. There appeared to be some old remnants of mucosal bleeding within the oral cavity, but there does not appear  to be any active bleeding within the oral cavity at present.  NECK: Supple without JVD or lymphadenopathy.  LUNGS: Clear to auscultation bilaterally with normal respiratory effort.  HEART: S1, S2 noted to be slightly tachycardic. No murmurs or rubs appreciated.  ABDOMEN: Soft, nontender, nondistended. Bowel sounds positive. No rebound or guarding. No gross organomegaly appreciated.  EXTREMITIES: No clubbing, cyanosis or edema noted.  NEUROLOGIC: The patient is alert and oriented to time, person, and place. Strength is 5 out of 5 in both upper and lower extremities.  SKIN: Warm and dry. No sign of purpuric lesions noted at this time.  GENITOURINARY: Foley catheter noted to be in place. Significant amount of blood noted in the Foley tubing.  MUSCULOSKELETAL: No joint redness, swelling or tenderness appreciated.  PSYCHIATRIC: The patient with appropriate affect and appears to have good insight into his current illness.   LABORATORY DATA: Sodium 139, potassium 4.2, chloride 102, CO2 20, BUN 78, creatinine 4.04, glucose 269. LDH at 374, total protein 6.4, albumin 3.5, total bilirubin 0.4, alkaline phosphatase 37, AST 18, ALT 14. Troponin of 0.04. CBC shows WBC 19.6, hemoglobin 10.6, hematocrit 33.4, platelets of 8000. INR 1.1, PTT 29.1, PT 14.7. D-dimer 3.79. Fibrinogen 343. Urinalysis shows RBCs of 72,415 and WBC of 324.   IMPRESSION: This is a 69 year old African American male with past medical history of hypertension, history of alcohol abuse, but abstinent for 2 years, tobacco abuse, who presented to [facility] with severe thrombocytopenia, acute renal failure and mild anemia after taking ibuprofen for 3 days at home.   PROBLEM LIST:  1.  Severe acute renal failure.  2.  Thrombocytopenia, question acute.  3.  Anemia, not otherwise specified.  4.  Gastrointestinal bleeding with hematemesis.  5.  Hematuria.   PLAN: The patient presents with a very interesting case at  present. His illness began with bilateral foot pain for which he began taking ibuprofen. Thereafter, he developed hematuria, oral bleeding and hematemesis. He was found to have severe thrombocytopenia with a platelet count of 8000, mild anemia, and severe acute renal failure with a creatinine of 4. Differential diagnosis is quite extensive. We certainly considered the possibility of TTP. I discussed the case immediately with Dr. Grayland Ormond. He stated that he did not see evidence for schistocytes. Therefore, TTP is currently lower on the differential. He could still potentially have ITP or immune mediated thrombocytopenia from his ibuprofen usage. Acute renal failure could also be secondary to blood loss and subsequent acute tubular necrosis. No urgent indication for dialysis at this point in time; however, we will continue to follow renal function quite closely. The patient had an ultrasound performed, which included the right and left kidney. There was no sign of hydronephrosis on that. We will also obtain further serologic workup including urine protein to creatinine ratio, repeat urinalysis, ANCA antibodies, GBM antibodies, C3, C4, and hepatitis serologies. I also discussed the case with Dr. Laurin Coder. It appears that the patient will be given platelets shortly. The patient also will likely be undergoing bone marrow biopsy tomorrow. Further plan as the patient progresses.     ____________________________ Tama High, MD [id number] D: 02/14/2013 12:35:03 ET T: 02/14/2013 13:09:21 ET JOB#: [id number]  cc: Tama High, MD, <Dictator> Albert Villarreal Righteous Claiborne MD ELECTRONICALLY SIGNED 04/01/2013 11:29

## 2015-04-20 NOTE — Consult Note (Signed)
PATIENT NAME:  Albert Villarreal, [name] MR#:  [id number] DATE OF BIRTH:  03/20/1946  DATE OF CONSULTATION:  02/14/2013  CONSULTING PHYSICIAN:  Scot Junobert T. Bellarae Lizer, MD  REASON FOR CONSULTATION: The patient is a 69 year old black male who was admitted to the hospital because of GI bleeding. On routine labs, he was found to have a critically low platelet count of 8000, and he has a history of hematemesis of coffee-ground material and passing melena. I was asked to see him in consultation for GI bleed. When the patient presented to the ER, he was hypothermic at 94 degrees. He had significant postural change and pulse from 90 to 130.   The patient has been taking ibuprofen, maybe 3 a day, and a couple of regular aspirin for pain in his foot. He has been doing this for at least a week, maybe longer. His bleeding with melena started a couple of days ago. He had bleeding from his gums that started 2 or 3 days ago, also.  The patient has a history of alcoholism, quit drinking 2 ago. He has a history of what he thinks his gout. Other medical problems include hypertension.   HABITS: No alcohol in 2 years.  He used to drink heavily. He smokes 2 packs a day. He is sexually active with women.   MEDICATIONS: HCTZ/lisinopril 12.5/20 mg a day, ibuprofen and aspirin.   REVIEW OF SYSTEMS: No fever or chills. No nosebleeds. No asthma, wheezing, coughing or hemoptysis. No chest pain or skipping heartbeats. No dysuria or hematuria.  He has had blood in his urine since had a Foley catheter put in.  He denies any petechiae or purpura or bleeding anywhere else except for hematemesis, melena and bleeding gums No history of previous stroke.    FAMILY HISTORY:  His father died of a stroke. Mother died in a fire.   PHYSICAL EXAMINATION: GENERAL: Black male in no acute distress.  VITAL SIGNS: Temperature 98.5, pulse 108, respirations 22, blood pressure 130/70, pulse oximetry 100% with 2 liters.  HEENT: Sclerae anicteric. Conjunctivae  negative. There is no bleeding in the sclerae. The oropharynx does not show any blood. He has several remaining teeth.  NECK: Shows no masses. No thyromegaly.  CHEST: Clear in anterolateral fields.  HEART:  A 1/6 systolic murmur.  ABDOMEN: No hepatosplenomegaly. No masses. No bruits. No significant tenderness.  EXTREMITIES: No edema. No swollen or inflamed joints.  It is difficult to appreciate petechiae because he is black, but no palpable purpura noted.   LABORATORY AND RADIOLOGICAL DATA:  Total protein 6.4, albumin 3.5, total bilirubin 0.4, alkaline phosphatase 37, SGOT 18 SGPT 14, and troponin 0.04. White blood count 19.6, hemoglobin 10.6, hematocrit 33.4, platelet count 8000, MCV 94, MCH 29.7. O positive blood with a negative antibody screen. Pro time 14.7, INR 1.1. D-dimer 3.79, fibrinogen 343. Urine is bloody. He had a traumatic catheterization. The hospitalist reported the prostate was small but hard. Lipase 76, glucose 269, BUN 78, creatinine 4, sodium 139, potassium 4.2, chloride 102, CO2 20, magnesium 1.9. LDH 374. White count 21.4 on repeat. He has toxic granulations on his smear toxic, toxic vacuolization as well.   Portable chest x-ray showed no acute cardiopulmonary disease. Ultrasound of the abdomen showed prostatic enlargement with debris in the urinary bladder area. No evidence of cholelithiasis, acute cholecystitis.   ASSESSMENT:  1.  Hypothermia, severe thrombocytopenia, elevated white count, small murmur, hard enlarged prostate with some resistance to insertion of a catheter in a patient who had recently taken  ibuprofen and aspirin He appears to be septic for reasons uncertain. Given his hypothermia, his elevated white count, his  severe thrombocytopenia, I would certainly await blood cultures and urine cultures, and would start him on empiric antibiotics for possible sepsis, and get an infectious disease consult.  2.  The use of nonsteroidals and aspirin can sometimes cause  autoimmune thrombocytopenia. The duration of medication use is unknown as to how long it takes for this effect. It is possible that he has bone marrow chronic damage related to a long history of alcoholism, and we do not know what his previous platelet count was. It would be helpful if we could obtain records from his previous doctor in [place].  3.  The infusion of platelets is recommended because of his GI bleeding. Endoscopic endeavors will fail in the presence of a platelet count this low. Once his platelet count is above 50,000, we may be able to scope him if he continues to bleed, but his bleeding could be from trivial sources and may stop when his platelet count is corrected. I will follow with you. Another possibility is of widespread bone metastasis from occult malignancy, possible lymphoma, leukemia, possible widespread prostate cancer, and I agree with a hematology/oncology consultation as well as infectious disease consultation.    I have discussed the case with Dr. Mordecai Maes, and I will follow with you.   ____________________________ Scot Jun, MD rte:cb D: 02/14/2013 16:01:04 ET T: 02/14/2013 16:40:03 ET JOB#: [id number]  cc: Scot Jun, MD, <Dictator> Scot Jun MD ELECTRONICALLY SIGNED 02/17/2013 8:00

## 2015-04-20 NOTE — Consult Note (Signed)
Brief Consult Note: Diagnosis: Hematuria.   Patient was seen by consultant.   Consult note dictated.   Comments: Foley placed without problems.  Electronic Signatures: Riki AltesStoioff, Javar Eshbach C (MD)  (Signed 17-Feb-14 16:29)  Authored: Brief Consult Note   Last Updated: 17-Feb-14 16:29 by Riki AltesStoioff, Shirl Ludington C (MD)

## 2015-04-20 NOTE — Consult Note (Signed)
Pt CC thrombocytopenia, GI bleed.  Pt feeling a little better today,  chest clear, heart 1/6 SEM, abd soft and non tender.  For bone marrow today then start steroids.  On empiric antibiotics. Will  Check blood for HCV and HIV.  No plans for EGD yet.   Electronic Signatures: Scot JunElliott, Robert T (MD)  (Signed on 18-Feb-14 08:10)  Authored  Last Updated: 18-Feb-14 08:10 by Scot JunElliott, Robert T (MD)

## 2015-04-20 NOTE — Consult Note (Signed)
CC: GI bleed with severe low platelet count.  Pt platelet ct up to 50K, await CBC tomorrow morning and will do EGD if it is above 50.  Discussed with patient.    Electronic Signatures: Scot JunElliott, Robert T (MD)  (Signed on 20-Feb-14 17:51)  Authored  Last Updated: 20-Feb-14 17:51 by Scot JunElliott, Robert T (MD)

## 2015-04-20 NOTE — Consult Note (Signed)
Pt CC acute severe thrombocytopenia With GI bleed.  Hgb drifting down, some darkness to stools he thinks.  No vomiting, no abd pain.  Steroids started, plt up a little to 17K.  Exam without abd tenderness, chest clear ant fields. VSS, afebrile. Not much change.  Await increased plts to do EGD.  Await bone marrow results.  Electronic Signatures: Scot JunElliott, Georgetta Crafton T (MD)  (Signed on 19-Feb-14 15:47)  Authored  Last Updated: 19-Feb-14 15:47 by Scot JunElliott, Metta Koranda T (MD)

## 2015-04-20 NOTE — H&P (Signed)
PATIENT NAME:  Albert Villarreal, BESECKER MR#:  [id number] DATE OF BIRTH:  December 11, 1946  DATE OF ADMISSION:  02/14/2013  REFERRING PHYSICIAN:  Dr. Lurline Hare.   CHIEF COMPLAINT: Hematemesis.   HISTORY OF PRESENT ILLNESS:  This is a 69 year old male who presents first time to [facility] with complaints of coffee-ground emesis. The patient reports he has history significant only for hypertension, where he is on p.o. meds for that. Reports history of remote alcohol heavy use, with none over the last 2 years. Daughter, at bedside, and she confirmed that, as well he denies any complications such as liver disease, as well denies any esophageal varices. Upon presentation, the patient reports he has been having some left foot pain due to gout, so he was taking ibuprofen. Reports he has been taking ibuprofen for the last 3 days, on average 3 to 4 pills every day. Reports yesterday evening he started having hematemesis/coffee-ground emesis, significant amount, almost continuous over the night. As well, he reported a couple of hours after that he started to have some bright red blood per rectum, which he reported then changed to melena.  The patient's hemoglobin was 10.4, unclear what is his baseline. As well, he was noticed to have thrombocytopenia with platelets of 8000. Discussed with the lab, and they there was no clumping on the specimen there. The patient denies any history of purpura or rash or any splenomegaly or liver disease. The patient's INR was 1.1, and LFTs did not show any elevation in total bilirubin. As well, the patient was said to be in acute renal failure with a creatinine of 4.04, unclear about his baseline, but he said he never had any issues with kidney disease in the past.  Tried to insert Foley catheter in the ED, which was hard to insert, which felt some resistance and only a few millimeters came back of very bloody urine, so the balloon was not deflated and the Foley catheter was pulled  out. The patient had around 140 mL of urine volume in bladder on bladder scan. The patient was noticed to have white blood cell count of 19.6 thousand.  Chest x-ray did not show any acute infiltrate. He denies any fever,  chills, cough, productive sputum or urinary problems. As well, he was hypothermic at 94 degrees when he came. The hospitalist service was requested to admit the patient for further management and workup of his GI bleed and thrombocytopenia. The patient denies any lightheadedness or dizziness, but when he was lying supine his heart rate was in the 90s. When we did sit him up, his heart rate went up to the 130s.   PAST MEDICAL HISTORY:   1.  Hypertension.  2.  History of alcohol abuse before 2 years, nothing since then.   3.  Tobacco abuse.   PAST SURGICAL HISTORY:  None.   FAMILY HISTORY:  Denies any history of cancer, peptic ulcer disease.   SOCIAL HISTORY:  Lives at home by himself. No history of illicit drug use. Used to drink alcohol heavily before 2 years, but nothing since then. Smokes 2 packs per day.   ALLERGIES: No known drug allergies.   HOME MEDICATIONS: 1.  Hydrochlorothiazide/lisinopril 12.5/20 mg oral daily.  2.  Ibuprofen as needed, on average 3 tablets per day for the last 2 to 3 days.    REVIEW OF SYSTEMS:   CONSTITUTIONAL:  Denies any fever or chills. Complains of fatigue and weakness.  EYES: Denies blurry vision, double vision, pain, inflammation  or glaucoma.  EAR, NOSE, THROAT:  Denies tinnitus, ear pain, hearing loss, epistaxis or discharge.  RESPIRATORY:  Denies cough, wheezing, hemoptysis, dyspnea or COPD.  CARDIOVASCULAR:  Denies chest pain, edema, arrhythmia, palpitations, syncope.  GASTROINTESTINAL: Denies any nausea, abdominal pain, diarrhea, constipation. Has complaints of coffee-ground emesis/hematemesis, as well melena.  GENITOURINARY:  Denies dysuria, hematuria or colic.  ENDOCRINE:  Denies polyuria, polydipsia, heat or cold intolerance.   HEMATOLOGY:  Denies anemia, easy bruising, bleeding diathesis. INTEGUMENT:  Denies acne, rash or skin lesions.  MUSCULOSKELETAL:  Complains of some gout pain in the left foot. Denies any back pain, knee pain, hip pain, arthritis-related activity.  NEUROLOGIC:  Denies numbness, dysarthria, epilepsy, tremors, vertigo, CVA, TIA, seizures or memory loss.  PSYCHIATRIC:  Denies anxiety, schizophrenia, insomnia, nervousness, depression or bipolar disorder.   PHYSICAL EXAMINATION: VITAL SIGNS:  Temperature 94, pulse 102, respiratory rate 17, blood pressure 149/93, saturating 92% on room air. Orthostatic was not checked as patient's heart rate went from 90s  from supine to 130s while sitting, so did not proceed with further measurement.  GENERAL:  Elderly male, currently less comfortable, but in no apparent distress.  HEENT:  Head atraumatic, normocephalic. Pupils equal and reactive to light. Pink conjunctivae. Anicteric sclerae. Dry oral mucosa.  NECK:  Supple. No thyromegaly. No JVD.  CHEST:  Good air entry bilaterally. No wheezing, rales or rhonchi.  CARDIOVASCULAR:  S1, S2 heard. No rubs, murmur or gallops. Tachycardic.  ABDOMEN:  Soft, nontender, nondistended. Bowel sounds present.  EXTREMITIES:  No edema. No clubbing. No cyanosis.  PSYCHIATRIC:  Appropriate affect. Awake, alert x 3. Intact judgment and insight.  RECTAL:  The patient had significant melena, with prostate being hard and not enlarged.  NEUROLOGIC:  Cranial nerves grossly intact. Motor 5/5.  SKIN:  Normal skin turgor. Warm and dry.   PERTINENT LABORATORY DATA AND RADIOLOGIC DATA: Glucose 269, BUN 78, creatinine 4.04, sodium 139, potassium 4.2, chloride 102, CO2 of 20. ALT 14, AST 18, alk phos 37. Troponin less than 0.04. White blood cell 19.6, hemoglobin 10.6, hematocrit 33.4, platelets 8.  INR 1.1.   Chest x-ray without evidence of acute cardiopulmonary disease.   ASSESSMENT AND PLAN:  This is a 69 year old male who presents with  hematemesis and melena, most likely due to ibuprofen, hemoglobin is 10.4, unclear baseline. As well, he is in acute renal failure, thrombocytopenia with leukocytosis and orthostasis.  1.  Gastrointestinal bleed. This is most likely upper GI bleed from using NSAIDs. The patient will be admitted to CCU, was started on Protonix drip in the ED. Denies any history of peptic ulcer disease, any history of esophageal varices or gastritis.  We will continue to check H and H every 6 hours, and we will transfuse packed red blood cells if needed.  If his hemoglobin continues to drop, we will try to correct his thrombocytopenia to target for platelets more than 50,000. Ordered 2 units of platelet transfusion. Discussed with Dr. Vira Agar, who evaluated the patient today. Will be kept n.p.o., on aggressive IV fluids.  2.  Thrombocytopenia, unclear etiology, possibly sepsis versus autoimmune versus  bone marrow suppression. Consulted Dr. Grayland Ormond, who will see the patient today.  3.  Orthostasis.  This is most likely due to volume depletion from GI bleed, to less likely degree of sepsis.  We will continue with fluid resuscitation. We will hold hypertensive meds.  4.  Acute renal failure, obstructive versus dehydration. We will continue with fluids. Consulted urology for Foley insertion. CT abdomen  ordered to check on the bladder, prostate and renals and spleen and liver to see if there is any obstructive uropathy with hydronephrosis or any prostate abnormality. We will discuss with Dr. Bernardo Heater.  5.  Leukocytosis, unclear if this is stress induced from his bleed and hypotension versus sepsis. We will send urine culture, urinalysis, as well we will send blood cultures.  6.  Deep vein thrombosis prophylaxis. Sequential compression device. No chemical anticoagulation due to GI bleed.  7.  Gastrointestinal prophylaxis. The patient is on Protonix drip.   CODE STATUS: The patient is full code.   The patient's condition is  critical.   Total time spent on admission and patient care and discussion with consults and of critical care time is 70 minutes.    ____________________________ Albertine Patricia, MD dse:dm D: 02/14/2013 09:13:00 ET T: 02/14/2013 11:25:01 ET JOB#: [id number]  cc: Albertine Patricia, MD, <Dictator> Teosha Casso Graciela Husbands MD ELECTRONICALLY SIGNED 02/23/2013 4:25

## 2015-04-20 NOTE — Discharge Summary (Signed)
PATIENT NAME:  Albert Villarreal, Albert Villarreal MR#:  [id number] DATE OF BIRTH:  1946/05/10  DATE OF ADMISSION:  02/14/2013 DATE OF DISCHARGE:  02/19/2013  FINAL DIAGNOSES:  1.  Acute gastrointestinal bleed with acute hemorrhagic anemia, gastritis on EGD.  2.  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 Bone marrow biopsy still pending.  3.  Orthostatic hypotension.  4.  Hematuria.  5.  Metabolic acidosis.  6.  Acute renal failure on chronic kidney disease.  7.  Systemic inflammatory response syndrome.   MEDICATIONS ON DISCHARGE:   1.  Prednisone 20 mg 3 tablets daily until seen by Dr. Grayland Ormond and he will start a taper.  2.  Ferrous sulfate 325 mg daily.  3.  Carafate 1 gram 1 tablet twice a day.  4.  Omeprazole 20 mg twice a day.  5.  Levofloxacin 500 mg daily for 6 days.   NOTE: Stop taking hydrochlorothiazide and lisinopril.   DIET: Low-sodium diet, regular consistency.   ACTIVITY: As tolerated.   FOLLOWUP:  Follow-up with Dr. Grayland Ormond, Oncology, call Monday morning for an appointment. I did speak with Dr. Kallie Edward who will try to set up something in the office for possibly Tuesday with Dr. Grayland Ormond. Follow up with the [facility] in 1 to 2 weeks.   REASON FOR ADMISSION: The patient was admitted 02/14/2013 and discharged on 02/19/2013. Consults during the hospital stay included Dr. Vira Agar, Gastroenterology, Dr. Bernardo Heater, Urology, Dr. Holley Raring, Nephrology, and Dr. Grayland Ormond, Hematology. The patient was admitted with hematemesis.   HISTORY OF PRESENT ILLNESS: The patient is a 69 year old man who presented with coffee-grounds emesis. He has been taking some ibuprofen for left foot pain prior to admission. He was found to have severe thrombocytopenia, platelet count of 8000. The patient was also in acute renal failure with a creatinine of 4.04. The patient was admitted with gastrointestinal bleed, acute hemorrhagic anemia, initially admitted to the Critical Care  Unit, started on Protonix drip for his thrombocytopenia. Dr. Grayland Ormond was consulted for his orthostatic hypotension. He was given IV fluid resuscitation for his acute renal failure. He was given IV fluid resuscitation.   LABORATORY AND RADIOLOGICAL DATA DURING THE HOSPITAL COURSE:  Included an EKG which showed sinus tachycardia, frequent premature ventricular complexes, PT 14.7, INR 1.1, PTT 29.1. Troponin negative. Lipase 76. Ethanol level less than 3, glucose 269, BUN 78, creatinine 4.04, sodium 139, potassium 4.2, chloride 102, CO2 of 20, calcium 8.3. Liver function test negative. White blood cell count 19.6, hemoglobin and hematocrit 10.6 and 33.4, platelet count of 8. Chest x-ray showed no acute cardiopulmonary disease. It looks like patient was transfused 2 units of platelets. Repeat platelet count is 8. Ultrasound of the abdomen showed prostatic enlargement, debris in the urinary bladder, and left renal cyst. Urine culture negative. Urinalysis did have 3+ bacteria and blood. Blood culture negative. Platelet antibody was positive. Haptoglobin was low at less than 10. Fibrin degradation products less than 10. LDH elevated at 374. Looks like another platelet unit was transfused on 02/14/2013. Repeat platelet count 7. Hepatitis A, B, and C panel, hepatitis A antibody total was positive, hepatitis B surface antigen negative, hepatitis B core antibody negative, hepatitis B surface antibody nonreactive, hepatitis C antibody negative. Complement C4 normal range. C3 of 118. Anti-glomerular basement membrane 4. ANCA panel negative. ANA negative.  Stool culture negative. Eosinophils, urine none. Platelet count repeat on the 17th up to 17,000. Creatinine improved to 2.85 on the 18th. On the 18th platelet count went down to 5  again. HIV antibodies were negative. On the 18th platelet count to 8 again. On the repeat on the 18th, platelet count up to 26,000. On the 19th, platelet count up to 33,000. On the 20th, platelet  count up to 58,000. Stool culture grew out yeast, no pathogenic Escherichia coli identified. On the 20th, creatinine 1.7. On the 21st, platelet count up to 118,000. Creatinine down to 1.47. On the 22nd, platelet count up to 186. Hemoglobin was 8.3. White count 27.2, creatinine 1.52.   HOSPITAL COURSE PER PROBLEM LIST:   1.  For the patient's acute GI bleed, acute hemorrhagic anemia and gastritis on EGD, the patient was put on Omeprazole and Carafate. The patient did have an upper endoscopy by Dr. Vira Agar which showed gastritis. Most likely this is secondary to the low platelets and NSAID use. The patient was advised not to use NSAIDs.  2.  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 Grayland Ormond. The patient received platelet products while here. The patient did receive high IV dose steroids which increased the platelet count. The patient did have a bone marrow biopsy; result is still pending. During the hospital course helped to follow up as outpatient with Dr. Grayland Ormond. I did speak with Dr. Kallie Edward since the platelet count was up to 186,000. Okay to send the patient home on 60 mg of prednisone daily until seen by Dr. Grayland Ormond at that time. Repeat counts will be done and a possible prednisone taper will be done at that time. Of note, the patient does have an elevated white count and anemia. The patient was given ferrous sulfate for the anemia. White count will be followed as an outpatient and patient was given empiric vancomycin and Zosyn while here. Cultures were negative. Switched over to Levaquin for a complete course.  3.  For the patient's orthostatic hypotension likely secondary to GI bleed, was given IV fluid hydration.  4.  For the patient's hematuria, most likely this was secondary to the low platelet count. This had resolved. Can follow up as outpatient with Dr. Bernardo Heater if needed.  5.  Metabolic acidosis had improved with IV fluid hydration secondary to  the acute renal failure.  6.  Acute renal failure, now probably chronic kidney disease. The patient was given IV fluids during the hospitalization. His lisinopril hydrochlorothiazide was stopped. Recommend checking a BMP as outpatient also.  7.  For the systemic inflammatory response syndrome, cultures were negative. The patient did receive vancomycin and Zosyn while here and switched over to Levaquin for a complete course as outpatient.   TIME SPENT ON DISCHARGE:  35 minutes    ____________________________ Tana Conch. Leslye Peer, MD rjw:jr D: 02/19/2013 16:26:25 ET T: 02/20/2013 05:54:29 ET JOB#: [id number]  cc: Tana Conch. Leslye Peer, MD, <Dictator> [place] Grayland Ormond, MD  Marisue Brooklyn MD ELECTRONICALLY SIGNED 02/28/2013 18:35

## 2015-04-20 NOTE — Consult Note (Signed)
Chief Complaint:  Subjective/Chief Complaint no complaints   VITAL SIGNS/ANCILLARY NOTES: **Vital Signs.:   19-Feb-14 07:00  Vital Signs Type Routine  Pulse Pulse 68  Respirations Respirations 20  Systolic BP Systolic BP 364  Diastolic BP (mmHg) Diastolic BP (mmHg) 49  Mean BP 73  Pulse Ox % Pulse Ox % 100  Oxygen Delivery Room Air/ 21 %  Pulse Ox Heart Rate 68   Brief Assessment:  Additional Physical Exam Foley draining clear urine   Lab Results: Hepatic:  19-Feb-14 00:30   Bilirubin, Total 0.9  Alkaline Phosphatase  44  SGPT (ALT) 18  SGOT (AST) 23  Total Protein, Serum  6.3  Albumin, Serum  3.1  Routine Chem:  19-Feb-14 00:30   Glucose, Serum  142  BUN  47  Creatinine (comp)  2.10  Sodium, Serum  147  Potassium, Serum 4.1  Chloride, Serum  112  CO2, Serum 27  Calcium (Total), Serum  8.0  Osmolality (calc) 307  eGFR (African American)  37  eGFR (Non-African American)  32 (eGFR values <38m/min/1.73 m2 may be an indication of chronic kidney disease (CKD). Calculated eGFR is useful in patients with stable renal function. The eGFR calculation will not be reliable in acutely ill patients when serum creatinine is changing rapidly. It is not useful in  patients on dialysis. The eGFR calculation may not be applicable to patients at the low and high extremes of body sizes, pregnant women, and vegetarians.)  Anion Gap 8  Routine Hem:  19-Feb-14 00:30   WBC (CBC)  23.3  RBC (CBC)  2.82  Hemoglobin (CBC)  8.4  Hematocrit (CBC)  25.1  Platelet Count (CBC)  33  MCV 89  MCH 29.8  MCHC 33.5  RDW 14.2  Neutrophil % 93.3  Lymphocyte % 4.3  Monocyte % 2.3  Eosinophil % 0.0  Basophil % 0.1  Neutrophil #  21.7  Lymphocyte # 1.0  Monocyte # 0.5  Eosinophil # 0.0  Basophil # 0.0 (Result(s) reported on 16 Feb 2013 at 12:56AM.)  Bands 2  Segmented Neutrophils 90  Lymphocytes 7  Eosinophil 1  Diff Comment 1 PLTS APPEAR DECREASD  Diff Comment 2 ANISOCYTOSIS   Result(s) reported on 16 Feb 2013 at 12:56AM.  Manual Diff MANUAL DIFF DONE  Result(s) reported on 16 Feb 2013 at 02:11AM.   Assessment/Plan:  Assessment/Plan:  Assessment 1. hematuria- resolved. Etiology undetermined 2. acute renal failure- improving   Plan 1. Pt was not in urinary retention and would d/c foley when no longer needed for output monitoring 2. He will need further upper tract imaging and cystoscopy which can be sched as an outpt 3. Will sign off   Electronic Signatures: SAbbie Sons(MD)  (Signed 19-Feb-14 07:52)  Authored: Chief Complaint, VITAL SIGNS/ANCILLARY NOTES, Brief Assessment, Lab Results, Assessment/Plan   Last Updated: 19-Feb-14 07:52 by SAbbie Sons(MD)

## 2015-04-20 NOTE — Consult Note (Signed)
  DATE OF BIRTH:  1946/01/30  DATE OF CONSULTATION:  02/14/2013  REQUESTING PHYSICIAN: Dr. Randol KernElgergawy.  CONSULTING PHYSICIAN: Scott C. Stoioff, MD   REASON FOR CONSULTATION: Inability to place Foley catheter.   HISTORY OF PRESENT ILLNESS: A 69 year old male presented to the Emergency Department with hematemesis, and is admitted for an upper GI bleed. He was found to have a creatinine of 4.04. Bladder scan showed an estimated bladder volume of 140 mL. Foley catheter was unable to be inserted, and urology consultation was requested. He denies any previous history of urologic problems. He has had no voiding problems, but does state on February 13, he began to have gross hematuria. He has a significant smoking history of 2 packs per day. He is also found to have significant thrombocytopenia.   PAST MEDICAL HISTORY: 1.  Hypertension.  2.  History of alcohol abuse, though quit 2 years ago.  3.  Tobacco abuse.   PAST SURGICAL HISTORY:  None.   FAMILY HISTORY:  Noncontributory.   SOCIAL HISTORY:   A 2 pack per day smoker. No alcohol for the past 2 years.   ALLERGIES:  No known drug allergies.   MEDICATIONS AT TIME OF ADMISSION:  1.  Hydrochlorothiazide/lisinopril 12.5/20 mg daily.  2.  Ibuprofen p.r.n.   REVIEW OF SYSTEMS:    CONSTITUTIONAL: Denies fever, chills. Positive fatigue.  EYES: No visual changes.  EARS, NOSE, THROAT: No nasal congestion, tinnitus.  PULMONARY: Denies cough, wheezing, shortness of breath.   CARDIOVASCULAR: Denies chest pain, palpitations, murmur. GASTROINTESTINAL:   Denies nausea, abdominal pain. Positive hematemesis.  GENITOURINARY:  As per the HPI.  HEMATOLOGIC:  No history of bleeding or clotting disorders.  ENDOCRINE:  Denies increased thirst, heat/cold intolerance.  SKIN:  No rashes.  MUSCULOSKELETAL:  Positive degenerative joint disease.  NEUROLOGIC:  No history of CVA, seizure.  PSYCHIATRIC:  Denies anxiety, depression.   PHYSICAL  EXAMINATION: VITAL SIGNS:  Temp was 94, pulse 102, BP 149/93.  GENERAL:  The patient was in no acute distress.  HEENT:  Normocephalic, atraumatic. Conjunctivae/sclerae clear.  NECK:  Supple, without adenopathy.  CARDIOVASCULAR:  Regular rate and rhythm, without murmur.  LUNGS:  Clear to auscultation.  ABDOMEN:  Soft, nontender. Bladder was not palpable or percussible.  GENITOURINARY: Phallus circumcised, without lesions. Testes descended bilaterally, slightly atrophic. No masses.  RECTAL:  Prostate slightly firm. Estimated volume approximately 50 to 60 mL.  NEUROLOGIC:  No focal deficits.  SKIN:  Warm, dry, normal turgor.   PROCEDURE:   An 2918 French coude catheter was inserted without problems, and approximately 100 mL of dark, old-appearing, bloody urine was obtained.   DATA:  Creatinine is 4.04. WBC 19.6, platelets 8000. Ultrasound:  Prostatic enlargement, no hydronephrosis. There is a left renal cyst present.   IMPRESSIONS: 1.  Gross hematuria - etiology undetermined. The patient does have significant tobacco history. 2.  Acute renal failure.  3.  Benign prostatic hypertrophy.   RECOMMENDATION:   1.  Would recommend further evaluation with a noncontrast CT of the abdomen and pelvis.  2.  He will eventually need cystoscopy and possible bilateral retrograde pyelograms for lower tract evaluation if his creatinine does not return to normal.  3.  Irrigate catheter as needed.  4.  Will follow.     ____________________________ Verna CzechScott C. Lonna CobbStoioff, MD scs:mr D: 02/14/2013 16:28:00 ET T: 02/14/2013 20:14:15 ET JOB#: [id number]  cc: Lorin PicketScott C. Lonna CobbStoioff, MD, <Dictator> Riki AltesSCOTT C STOIOFF MD ELECTRONICALLY SIGNED 02/16/2013 8:04

## 2015-04-20 NOTE — Consult Note (Signed)
History of Present Illness:  Reason for Consult Severe pancytopenia with bleeding.   HPI   Patient is a 69 year old male who presented to the emergency room with approximately 1 days worth of hematemesis/ coffee-ground emesis. He does admit to taking ibuprofen for the last several days for a gout flare in his foot.  He also noted hematuria.  He otherwise has felt well and at his baseline.  He has no neurologic complaints.  He denies any recent fevers or illnesses.  He is a good appetite and denies weight loss.  He has no night sweats.  He denies any chest pain or shortness of breath.  He denies any nausea, constipation, or diarrhea.  He has no other urinary complaints.  Patient otherwise feels well and offers no further specific complaints  PFSH:  Additional Past Medical and Surgical History hypertension, heavy alcohol use.  Family history: Negative and noncontributory.  Social history: Previous heavy alcohol, none for 2 years.  Positive tobacco, patient continues to smoke, over 2 packs per day.   Review of Systems:  Performance Status (ECOG) 0   Review of Systems   As per HPI. Otherwise, 10 point system review was negative.   NURSING NOTES: **Vital Signs.:   17-Feb-14 17:00   Vital Signs Type: Blood Transfusion Complete   Temperature Temperature (F): 98.5   Celsius: 36.9   Temperature Source: oral   Pulse Pulse: 106   Respirations Respirations: 25   Systolic BP Systolic BP: 700   Diastolic BP (mmHg) Diastolic BP (mmHg): 42   Mean BP: 80   Pulse Ox % Pulse Ox %: 51   Oxygen Delivery: 2L; Nasal Cannula   Physical Exam:  Physical Exam General: thin, no acute distress. Eyes: Pink conjunctiva, anicteric sclera. HEENT: Normocephalic, moist mucous membranes, clear oropharnyx. Lungs: Clear to auscultation bilaterally. Heart: Regular rate and rhythm. No rubs, murmurs, or gallops. Abdomen: Soft, nontender, nondistended. No organomegaly noted, normoactive bowel  sounds. Musculoskeletal: No edema, cyanosis, or clubbing. Neuro: Alert, answering all questions appropriately. Cranial nerves grossly intact. Skin: No rashes or petechiae noted. Psych: Normal affect. Lymphatics: No cervical, calvicular, axillary or inguinal LAD.    No Known Allergies:     hydrochlorothiazide-lisinopril 12.5 mg-20 mg oral tablet: 1 tab(s) orally once a day, Status: Active, Quantity: 0, Refills: None  Laboratory Results:  Routine Chem:  17-Feb-14 12:19   Result Comment PLATELET - VERIFIED BY SMEAR ESTIMATE  - RESULTS VERIFIED BY REPEAT TESTING.  - CRITICAL VALUE PREVIOUSLY NOTIFIED.  Result(s) reported on 14 Feb 2013 at 12:59PM.  Routine Hem:  17-Feb-14 12:19   WBC (CBC)  21.4  RBC (CBC)  3.15  Hemoglobin (CBC)  9.4  Hematocrit (CBC)  28.9  Platelet Count (CBC)  7  MCV 92  MCH 30.0  MCHC 32.6  RDW 13.3  Segmented Neutrophils 93  Lymphocytes 6  Monocytes 1  Diff Comment 1 POIKILOCYTOSIS  Diff Comment 2 TOXIC VACUOLIZATION  Diff Comment 3 TOXIC GRANULATION  Diff Comment 4 NORMAL PLT MORPHOLGY  Result(s) reported on 14 Feb 2013 at 12:59PM.  Manual Diff MANUAL DIFF DONE  Result(s) reported on 14 Feb 2013 at 12:58PM.   Assessment and Plan: Impression:   Severe thrombocytopenia with bleeding. Plan:   1.  Thrombocytopenia: Peripheral blood smear has been reviewed and there are no evidence of schistocytes indicating underlying TTP.  Unclear etiology at this point.  Patient will have a bone marrow biopsy on Tuesday, February 15, 2013 at 9:30 AM for further evaluation.  Recommend  transfusing platelets to greater than 20 if his bleeding stops.  If patient continues to bleed, attempt to increase platelet count to approximately 50.  Once bone marrow biopsy is complete, will empirically initiate steroids. Anemia: Secondary to hematemesis.  Patient does not require a blood transfusion at this time.  Once platelet issue is resolved, he will require an EGD for further  evaluation. Hematuria: Treatment per Urology.  Likely related to underly thrombocytopenia. Leukocytosis: Possibly reactive, but agree with empiric antibiotics for possible underlying sepsis. Renal failure: Unclear patient's baseline. Nephrology following.  consult, will folllow  Electronic Signatures: Delight Hoh (MD)  (Signed 17-Feb-14 18:33)  Authored: HISTORY OF PRESENT ILLNESS, PFSH, ROS, NURSING NOTES, PE, ALLERGIES, HOME MEDICATIONS, LABS, ASSESSMENT AND PLAN   Last Updated: 17-Feb-14 18:33 by Delight Hoh (MD)

## 2015-04-21 NOTE — H&P (Signed)
PATIENT NAME:  Albert Villarreal, Albert Villarreal MR#:  [id number] DATE OF BIRTH:  02/10/1946  DATE OF ADMISSION:  01/12/2014  REFERRING PHYSICIAN: Dr. Margarita Grizzle.   PRIMARY CARE PHYSICIAN: Nonlocal, Dr. Roxan Hockey.   CHIEF COMPLAINT: Shortness of breath and right-sided pain.   HISTORY OF PRESENT ILLNESS: A 69 year old African American gentleman with past medical history of ITP and hypertension, presenting with shortness of breath. Ten day duration of shortness of breath which has been progressively worsening. Originally started as URI-like symptoms with nonproductive cough. It has been progressively becoming worsen, now productive cough of yellowish sputum with associated subjective fevers and chills. He started on Levaquin by his PCP as an outpatient without improvement of his symptoms. He has associated right-sided pain which is essentially mid axillary, lower rib pain that he describes as sharp, worse with cough. No relieving factors. Currently 10 out of 10 but nonpleuritic, nonexertional. In the Emergency Department thus far he required supplemental O2 to maintain O2 saturation greater than 92%. He also received antibiotic therapy with vancomycin and Levaquin. Currently complaining of shortness of breath as well as right-sided pain.   REVIEW OF SYSTEMS:   CONSTITUTIONAL: Positive for subjective fevers, chills. Denies any weakness or change in weight.  EYES: Denies blurred vision, double vision, eye pain.  EARS, NOSE, THROAT: Denies tinnitus, ear pain, hearing loss.  RESPIRATORY: Positive for cough and shortness of breath as described above. Denies any wheeze.  CARDIOVASCULAR: Positive for chest pain as described above. Denies any edema, palpitations or orthopnea.  GASTROINTESTINAL: Denies nausea, vomiting, diarrhea, abdominal pain, hematemesis.  GENITOURINARY: Denies dysuria or hematuria.  ENDOCRINE: Denies nocturia or thyroid problems.  HEMATOLOGIC AND LYMPHATIC: Denies easy bruising or bleeding.  SKIN: Denies  rash or lesions.  MUSCULOSKELETAL: Denies pain in neck, back, shoulder, knees, hips or arthritic symptoms.  NEUROLOGIC: Denies paralysis, paresthesias.  PSYCHIATRIC: Denies anxiety or depressive symptoms.   Otherwise, full review of systems performed by me is negative.   PAST MEDICAL HISTORY: Hypertension, as well as ITP. He was in our hospital  about a year ago for hematemesis which was found to be related to ITP.    SOCIAL HISTORY: Every day tobacco user, though has not been able to smoke for about the last week and a half given above symptoms. Denies any alcohol or drug usage.   FAMILY HISTORY: Denies any known cardiac or pulmonary diseases.   ALLERGIES: No known drug allergies.   HOME MEDICATIONS: Include hydrochlorothiazide/lisinopril 12.5/20 mg p.o. daily, as well as levofloxacin 750 mg p.o. daily   PHYSICAL EXAMINATION:  VITAL SIGNS: Temperature 99, heart rate 120, respirations 42, blood pressure 90/50, saturating 91% on supplemental O2. Weight 60.3 kg, BMI 24.4.  GENERAL: Ill-appearing African American gentleman in mild to moderate distress given respiratory status and pain.  HEAD: Normocephalic, atraumatic.  EYES: Pupils equal, round and reactive to light. Extraocular muscles intact. No scleral icterus.  MOUTH: Dry mucosal membranes. Dentition intact. No abscess noted.  EARS, NOSE, THROAT: Throat clear without exudates. No external lesions.  NECK: Supple. No thyromegaly. No nodules. No JVD.  PULMONARY: Decreased breath sounds right base with associated rhonchi. Currently tachypneic. Good respiratory effort. No use of accessory muscles.  CHEST: Mild tenderness to palpation over right midaxillary line.  CARDIOVASCULAR: S1, S2, tachycardic. No murmur, rub or gallop. No edema. Pedal pulses 2+ bilaterally.  GASTROINTESTINAL: Soft, nontender, nondistended. No masses. Positive bowel sounds. No hepatosplenomegaly.  MUSCULOSKELETAL: No swelling, clubbing, edema. Range of motion full in  all extremities.  NEUROLOGIC: Cranial nerves II  through XII intact. No gross focal neurological deficits. Sensation intact. Reflexes intact.  SKIN: No ulcerations, lesions, rash or cyanosis. Skin warm, dry. Turgor intact.  PSYCHIATRIC: Mood and affect: Appears anxious. Awake, alert and oriented x 3. Insight and judgment intact. He does have conversational dyspnea and inability to speak in full sentences given respiratory status as well as pain.   LABORATORY DATA: Sodium 132, potassium 4.8, chloride 100, bicarb 23, BUN 29, creatinine 2.33, glucose 147. BNP 1027. Troponin less than 0.02. WBC is 25.2, hemoglobin 15.9, platelets 377. Chest x-ray revealing right lower lobe interstitial infiltrate consistent with pneumonia.   ASSESSMENT AND PLAN: A 69 year old African American gentleman presenting with shortness of breath, found to have pneumonia.  1. Sepsis: Meeting septic criteria by heart rate as well as leukocytosis secondary to community-acquired pneumonia. Blood cultures, sputum cultures will be sent. Antibiotic coverage with Levaquin and vancomycin. 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 Intravenous fluid hydration to keep mean arterial pressure greater than 65.  2. Acute hypoxemic respiratory failure given by respiratory rate and inability to speak in complete sentences: Provide DuoNeb therapy q.4 hours. Add supplemental oxygen to keep oxygen saturation greater than 92%, as well as incentive spirometry. If condition does not improve, he may require BiPAP therapy.  3. Acute kidney injury on chronic kidney disease: Intravenous fluid hydration with normal saline. Follow urine output and renal function. Suspect this is prerenal given his overall general state and hypoperfusion.  4. Hypertension: Hold hydrochlorothiazide/lisinopril given acute kidney injury and the relative hypotension state.  5. Venous  thromboembolism prophylaxis with heparin subcutaneous.   The patient is FULL CODE.   TIME SPENT: 55 minutes.   ____________________________ Cletis Athensavid K. Hower, MD dkh:gb D: 01/12/2014 22:58:34 ET T: 01/12/2014 23:12:26 ET JOB#: [id number]  cc: Cletis Athensavid K. Hower, MD, <Dictator> DAVID Synetta ShadowK HOWER MD ELECTRONICALLY SIGNED 01/13/2014 1:25

## 2015-04-21 NOTE — Discharge Summary (Signed)
PATIENT NAME:  Albert MaduraWRIGHT, Jahfari MR#:  [id number] DATE OF BIRTH:  05-Nov-1946  DATE OF ADMISSION:  01/12/2014 DATE OF DISCHARGE:  01/15/2014  ADMISSION DIAGNOSES:  1.  Sepsis. 2.  Pneumonia.  DISCHARGE DIAGNOSIS:   1.  Sepsis secondary to pneumonia. 2.  Community-acquired pneumonia. 3.  Acute kidney injury. 4.  Acute respiratory failure.  5.  Hypertension. 6.  Poor appetite.   LABORATORY DATA: Blood cultures negative to date. Discharge white blood cells 16.8, hemoglobin 13.2, hematocrit 39.1, platelets are 408.   HOSPITAL COURSE: This is a 69 year old male, who presented with respiratory failure, elevated white blood cell count and bilateral pneumonia. For further details, please refer to the H and P.  1.  Sepsis. The patient was admitted with sepsis secondary to community-acquired pneumonia. The patient was treated for sepsis with antibiotics for his pneumonia as outlined below. 2.  Community-acquired pneumonia. The patient was started on Zosyn, Levaquin and vancomycin. He was started on these broad-spectrum antibiotics because the patient had been on steroids for ITP in the past. He tolerated these medications well. He had a fever once during the hospitalization, but he is afebrile for 48 hours. His blood cultures are negative to date. He will be discharged back with Levaquin.  3.  Acute kidney injury on chronic kidney disease, stage III. His baseline creatinine is 1.3 to 1.9. This improved with IV fluids and is likely secondary to sepsis.  4.  Acute respiratory failure. This has much improved from admission. This is secondary to pneumonia.  5.  Hypertension. The patient had low blood pressure initially from his sepsis, which did improve and his blood pressure systolic is running anywhere between 104 to 110. We are holding his outpatient medications.  6.  Poor appetite from his sepsis. It has slightly improved.   DISCHARGE MEDICATIONS:   1.  Levaquin 750 mg daily for 5 days.  3.  We had  stopped his blood pressure medications. He is supposed to monitor his blood pressure daily. If it is greater than 130/80, then he will restart his blood pressure medications.   DISCHARGE DIET: Regular diet. Supplement: Ensure 2 times a day.   DISCHARGE ACTIVITY: As tolerated.   DISCHARGE FOLLOWUP: The patient sees Dr. Roxan Hockeyobinson, who is local to follow up with.  TIME SPENT: 40 minutes. The patient is medically stable for discharge. ____________________________ Elston Aldape P. Juliene PinaMody, MD spm:aw D: 01/15/2014 12:50:34 ET T: 01/16/2014 06:42:40 ET JOB#: [id number]  cc: Harneet Noblett P. Juliene PinaMody, MD, <Dictator> Janyth ContesSITAL P Cortni Tays MD ELECTRONICALLY SIGNED 01/18/2014 13:13

## 2015-06-15 DIAGNOSIS — N183 Chronic kidney disease, stage 3 unspecified: Secondary | ICD-10-CM

## 2015-06-15 DIAGNOSIS — I10 Essential (primary) hypertension: Secondary | ICD-10-CM

## 2015-06-15 DIAGNOSIS — N200 Calculus of kidney: Secondary | ICD-10-CM | POA: Insufficient documentation

## 2015-06-15 DIAGNOSIS — M1990 Unspecified osteoarthritis, unspecified site: Secondary | ICD-10-CM

## 2015-06-15 DIAGNOSIS — I739 Peripheral vascular disease, unspecified: Secondary | ICD-10-CM

## 2015-06-15 HISTORY — DX: Essential (primary) hypertension: I10

## 2015-06-15 HISTORY — DX: Chronic kidney disease, stage 3 unspecified: N18.30

## 2015-06-19 NOTE — Progress Notes (Signed)
Patient ID: Albert Villarreal, male   DOB: Aug 03, 1946, 69 y.o.   MRN: [id number]     Cardiology Office Note   Date:  06/22/2015   ID:  Albert Villarreal, DOB 12-Oct-1946, MRN [id number]  PCP:  Quinn Axe, PA-C  Cardiologist:   Charlton Haws, MD   No chief complaint on file.     History of Present Illness: Albert Villarreal is a 69 y.o. male who presents for evaluation of claudication     No past medical history on file.  No past surgical history on file.   Current Outpatient Prescriptions  Medication Sig Dispense Refill  . lisinopril (PRINIVIL,ZESTRIL) 20 MG tablet Take 20 mg by mouth daily.     No current facility-administered medications for this visit.    Allergies:   Review of patient's allergies indicates not on file.    Social History:  The patient  reports that he quit smoking about 2 years ago. His smoking use included Cigarettes. He does not have any smokeless tobacco history on file.   Family History:  The patient's family history is not on file.    ROS:  Please see the history of present illness.   Otherwise, review of systems are positive for none.   All other systems are reviewed and negative.    PHYSICAL EXAM: VS:  BP 150/82 mmHg  Pulse 55  Ht 5\' 2"  (1.575 m)  Wt 65.59 kg (144 lb 9.6 oz)  BMI 26.44 kg/m2  SpO2 98% , BMI Body mass index is 26.44 kg/(m^2). Affect appropriate Healthy:  appears stated age HEENT: normal Neck supple with no adenopathy JVP normal no bruits no thyromegaly Lungs clear with no wheezing and good diaphragmatic motion Heart:  S1/S2 no murmur, no rub, gallop or click PMI normal Abdomen: benighn, BS positve, no tenderness, no AAA no bruit.  No HSM or HJR Distal pulses intact with no bruits No edema Neuro non-focal Skin warm and dry No muscular weakness    EKG:  NSR rate 55 normal ECG    Recent Labs: No results found for requested labs within last 365 days.    Lipid Panel No results found for: CHOL, TRIG, HDL,  CHOLHDL, VLDL, LDLCALC, LDLDIRECT    Wt Readings from Last 3 Encounters:  06/22/15 65.59 kg (144 lb 9.6 oz)      Other studies Reviewed: Additional studies/ records that were reviewed today include: [facility] records.    ASSESSMENT AND PLAN:  1.  Leg Pain:  Reasonable story for vascular leg pain.  Exam with no femoral bruits but decreased pulses from popliteal down suggesting possible SFA disease Will order LE Arterial Duplex and ABI's  FU with Dr Allyson Sabal regarding possible PVD 2. HTN:  Well controlled.  Continue current medications and low sodium Dash type diet.      Current medicines are reviewed at length with the patient today.  The patient does not have concerns regarding medicines.  The following changes have been made:  no change  Labs/ tests ordered today include: ABI's LE arterial duplex  Orders Placed This Encounter  Procedures  . EKG 12-Lead     Disposition:   FU with Dr Allyson Sabal     Signed, Charlton Haws, MD  06/22/2015 11:27 AM    [facility] [address] [place], [place], [place]  [address] Phone: [phone]; Fax: [phone]

## 2015-06-22 ENCOUNTER — Ambulatory Visit (INDEPENDENT_AMBULATORY_CARE_PROVIDER_SITE_OTHER): Payer: Commercial Managed Care - HMO | Admitting: Cardiovascular Disease

## 2015-06-22 ENCOUNTER — Encounter: Payer: Self-pay | Admitting: Cardiovascular Disease

## 2015-06-22 VITALS — BP 150/82 | HR 55 | Ht 62.0 in | Wt 144.6 lb

## 2015-06-22 DIAGNOSIS — M79605 Pain in left leg: Secondary | ICD-10-CM

## 2015-06-22 DIAGNOSIS — M79604 Pain in right leg: Secondary | ICD-10-CM | POA: Diagnosis not present

## 2015-06-22 DIAGNOSIS — I10 Essential (primary) hypertension: Secondary | ICD-10-CM | POA: Diagnosis not present

## 2015-06-22 NOTE — Patient Instructions (Signed)
Your physician recommends that you schedule a follow-up appointment in: after testing with Dr Wyline Mood   Your physician has requested that you have a lower or upper extremity arterial duplex. This test is an ultrasound of the arteries in the legs or arms. It looks at arterial blood flow in the legs and arms. Allow one hour for Lower and Upper Arterial scans. There are no restrictions or special instructions     Your physician recommends that you continue on your current medications as directed. Please refer to the Current Medication list given to you today.     Thank you for choosing [facility] !

## 2015-06-28 ENCOUNTER — Telehealth: Payer: Self-pay | Admitting: Cardiovascular Disease

## 2015-06-28 ENCOUNTER — Other Ambulatory Visit: Payer: Self-pay | Admitting: Adult Health

## 2015-06-28 ENCOUNTER — Other Ambulatory Visit: Payer: Self-pay

## 2015-06-28 DIAGNOSIS — M79605 Pain in left leg: Principal | ICD-10-CM

## 2015-06-28 DIAGNOSIS — M79604 Pain in right leg: Secondary | ICD-10-CM

## 2015-06-28 NOTE — Telephone Encounter (Signed)
Spoke with Lorin PicketKathyrn Lawrence NP & she will take care of the US order completion. Tried to call Harriett Sineancy back but no answer & no voicemail .  Mylo Redebbie Carla Whilden RN

## 2015-06-28 NOTE — Telephone Encounter (Signed)
New message     Pt having ultrasound artia seg tomorrow 06-29-15 @ 9:15 need an e-signed order Please advise

## 2015-06-29 ENCOUNTER — Ambulatory Visit (HOSPITAL_COMMUNITY)
Admission: RE | Admit: 2015-06-29 | Discharge: 2015-06-29 | Disposition: A | Discharge status: Home / Self Care | Payer: Medicare HMO | Source: Ambulatory Visit | Attending: Cardiovascular Disease | Admitting: Cardiovascular Disease

## 2015-06-29 DIAGNOSIS — I771 Stricture of artery: Secondary | ICD-10-CM | POA: Insufficient documentation

## 2015-06-29 DIAGNOSIS — I739 Peripheral vascular disease, unspecified: Secondary | ICD-10-CM | POA: Diagnosis present

## 2015-07-11 ENCOUNTER — Telehealth: Payer: Self-pay | Admitting: Cardiovascular Disease

## 2015-07-11 NOTE — Telephone Encounter (Signed)
New message ° ° ° ° °Returning Christine's call °

## 2015-07-11 NOTE — Telephone Encounter (Signed)
LM TO CALL BACK ./CY 

## 2015-07-13 NOTE — Telephone Encounter (Signed)
Informed patient that per PV study, Dr. Eden EmmsNishan is referring him to Dr. Allyson SabalBerry or Dr. Kirke CorinArida for eval of moderate PVD. Informed scheduler will call him with appointment information. Pt verbalizes understanding.

## 2015-07-13 NOTE — Telephone Encounter (Signed)
Follow up ° ° ° ° ° °Returning Albert Villarreal's call °

## 2015-07-30 ENCOUNTER — Ambulatory Visit: Payer: Commercial Managed Care - HMO | Admitting: Cardiology

## 2015-08-07 ENCOUNTER — Ambulatory Visit: Payer: Commercial Managed Care - HMO | Admitting: Cardiovascular Disease

## 2015-08-24 ENCOUNTER — Telehealth: Payer: Self-pay | Admitting: *Deleted

## 2015-08-24 ENCOUNTER — Encounter: Payer: Self-pay | Admitting: Cardiovascular Disease

## 2015-08-24 ENCOUNTER — Ambulatory Visit (INDEPENDENT_AMBULATORY_CARE_PROVIDER_SITE_OTHER): Payer: Medicare HMO | Admitting: Cardiovascular Disease

## 2015-08-24 ENCOUNTER — Ambulatory Visit
Admission: RE | Admit: 2015-08-24 | Discharge: 2015-08-24 | Disposition: A | Discharge status: Home / Self Care | Payer: Medicare HMO | Source: Ambulatory Visit | Attending: Cardiovascular Disease | Admitting: Cardiovascular Disease

## 2015-08-24 ENCOUNTER — Telehealth: Payer: Self-pay

## 2015-08-24 VITALS — BP 100/76 | HR 52 | Ht 62.0 in | Wt 146.0 lb

## 2015-08-24 DIAGNOSIS — I10 Essential (primary) hypertension: Secondary | ICD-10-CM | POA: Insufficient documentation

## 2015-08-24 DIAGNOSIS — I739 Peripheral vascular disease, unspecified: Secondary | ICD-10-CM | POA: Diagnosis not present

## 2015-08-24 HISTORY — DX: Peripheral vascular disease, unspecified: I73.9

## 2015-08-24 LAB — HEPATIC FUNCTION PANEL
ALK PHOS: 51 U/L (ref 38–126)
ALT: 17 U/L (ref 17–63)
AST: 20 U/L (ref 15–41)
Albumin: 4.1 g/dL (ref 3.5–5.0)
BILIRUBIN TOTAL: 0.5 mg/dL (ref 0.3–1.2)
Total Protein: 7.7 g/dL (ref 6.5–8.1)

## 2015-08-24 LAB — LIPID PANEL
CHOL/HDL RATIO: 4.4 ratio
CHOLESTEROL: 160 mg/dL (ref 0–200)
HDL: 36 mg/dL — AB (ref >40)
LDL Cholesterol: 106 mg/dL — ABNORMAL HIGH (ref 0–99)
TRIGLYCERIDES: 88 mg/dL (ref <150)
VLDL: 18 mg/dL (ref 0–40)

## 2015-08-24 MED ORDER — ASPIRIN EC 81 MG PO TBEC: 81.0000 mg | DELAYED_RELEASE_TABLET | Freq: Every day | ORAL | Status: DC

## 2015-08-24 NOTE — Telephone Encounter (Signed)
Spoke with pt need to schedule LE ART, he was getting blood drawn at the time, pt states he will call us back when he is done with this.

## 2015-08-24 NOTE — Progress Notes (Signed)
Primary care physician: Dr. Ferdie Ping  HPI  This is a pleasant 69 year old African-American male who was referred by Dr. Merilynn Finland and Dr.Nishan for evaluation and management of claudication and recently diagnosed peripheral arterial disease. The patient has known history of hypertension and previous tobacco use. He quit smoking 2 years ago but used to be a heavy smoker since he was 69 years old. He is not diabetic. He reports being much less active since retirement and since then he has noticed gradual calf discomfort bilaterally worse on the right side. This happens after walking about 100 feet and it's most noticeable when he is going uphill. If he is walking on a flat level, is usually able to walk through this and does not have to stop. If he is going uphill, he has to stop for few minutes before he can resume. There is no rest pain and no lower extremity ulceration. 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 The patient required transfusion. EGD showed gastritis. He was treated with IV steroids with improvement in platelet count. He underwent ABI recently which was moderately reduced bilaterally at 0.57 on the right and 0.65 on the left. He does have chronic kidney disease and most recent creatinine in our system was 1.87 from last year.   No Known Allergies   Current Outpatient Prescriptions on File Prior to Visit  Medication Sig Dispense Refill  . lisinopril (PRINIVIL,ZESTRIL) 20 MG tablet Take 20 mg by mouth daily.     No current facility-administered medications on file prior to visit.     Past Medical History  Diagnosis Date  . Hypertension   . Claudication of right lower extremity   . Arthritis   . Chronic kidney disease   . Kidney stones      History reviewed. No pertinent past surgical history.   Family History  Problem Relation Age of Onset  . Hypertension Mother   . Hypertension  Father      Social History   Social History  . Marital Status: Single    Spouse Name: N/A  . Number of Children: N/A  . Years of Education: N/A   Occupational History  . Not on file.   Social History Main Topics  . Smoking status: Former Smoker    Types: Cigarettes    Quit date: 12/29/2012  . Smokeless tobacco: Not on file  . Alcohol Use: No  . Drug Use: No  . Sexual Activity: Not on file   Other Topics Concern  . Not on file   Social History Narrative     ROS A 10 point review of system was performed. It is negative other than that mentioned in the history of present illness.   PHYSICAL EXAM   BP 100/76 mmHg  Pulse 52  Ht 5\' 2"  (1.575 m)  Wt 146 lb (66.225 kg)  BMI 26.70 kg/m2 Constitutional: She is oriented to person, place, and time. She appears well-developed and well-nourished. No distress.  HENT: No nasal discharge.  Head: Normocephalic and atraumatic.  Eyes: Pupils are equal and round. No discharge.  Neck: Normal range of motion. Neck supple. No JVD present. No thyromegaly present.  Cardiovascular: Normal rate, regular rhythm, normal heart sounds. Exam reveals no gallop and no friction rub. No murmur heard.  Pulmonary/Chest: Effort normal and breath sounds normal. No stridor. No respiratory distress. She has no wheezes. She has no rales. She exhibits no tenderness.  Abdominal:  Soft. Bowel sounds are normal. She exhibits no distension. There is no tenderness. There is no rebound and no guarding.  Musculoskeletal: Normal range of motion. She exhibits no edema and no tenderness.  Neurological: She is alert and oriented to person, place, and time. Coordination normal.  Skin: Skin is warm and dry. No rash noted. She is not diaphoretic. No erythema. No pallor.  Psychiatric: She has a normal mood and affect. Her behavior is normal. Judgment and thought content normal.  Vascular: Femoral pulses are normal. Distal pulses are not palpable.   NWG:NFAOZ   Bradycardia  WITHIN NORMAL LIMITS    ASSESSMENT AND PLAN

## 2015-08-24 NOTE — Telephone Encounter (Signed)
Called to notify pt that Dr. Kirke Corin would like lower extremity arterial duplex. No answer at home number, no VM No answer cell number and mailbox full  Will call again and ask scheduling to call pt.

## 2015-08-24 NOTE — Assessment & Plan Note (Addendum)
The patient has severe bilateral calf claudication worse on the right side likely due to SFA/popliteal disease. His femoral pulses are normal and he does not seem to have an aortoiliac disease. I discussed the natural history of claudication and management options. Given that his symptoms are quite severe, I discussed with him the option of proceeding with angiography and possible endovascular intervention. The other option would be to attempt a walking program for few months and reevaluate symptoms. The patient prefers the second option. He does have chronic kidney disease and this has to be factored in when considering angiography. I started low dose aspirin 81 mg once daily. I gave him the instruction on the proper way of exercise for PAD. I I also requested lower extremity arterial duplex to evaluate the location of the disease. Fasting lipid and liver profile were also done and there is an indication for a statin.

## 2015-08-24 NOTE — Assessment & Plan Note (Signed)
Blood pressure is controlled on lisinopril. 

## 2015-08-24 NOTE — Patient Instructions (Addendum)
Medication Instructions:  Your physician has recommended you make the following change in your medication:  START taking aspirin  once per day   Labwork: Your physician recommends that you have a FASTING lipid and liver profile today   Testing/Procedures: Your physician has requested that you have a lower extremity arterial duplex. This test is an ultrasound of the arteries in the legs. It looks at arterial blood flow in the legs. Allow one hour for LowerArterial scans. There are no restrictions or special instructions   Follow-Up: Your physician recommends that you schedule a follow-up appointment in: three months with Dr. Kirke Corin.    Any Other Special Instructions Will Be Listed Below (If Applicable).

## 2015-09-05 ENCOUNTER — Other Ambulatory Visit: Payer: Self-pay

## 2015-09-05 MED ORDER — ATORVASTATIN CALCIUM 20 MG PO TABS: 20.0000 mg | ORAL_TABLET | Freq: Every day | ORAL | Status: DC

## 2015-09-06 ENCOUNTER — Other Ambulatory Visit: Payer: Self-pay | Admitting: Cardiovascular Disease

## 2015-09-06 DIAGNOSIS — I779 Disorder of arteries and arterioles, unspecified: Secondary | ICD-10-CM

## 2015-09-11 ENCOUNTER — Other Ambulatory Visit: Payer: Self-pay | Admitting: Cardiovascular Disease

## 2015-09-11 ENCOUNTER — Ambulatory Visit (INDEPENDENT_AMBULATORY_CARE_PROVIDER_SITE_OTHER): Payer: Medicare HMO

## 2015-09-11 DIAGNOSIS — I779 Disorder of arteries and arterioles, unspecified: Secondary | ICD-10-CM | POA: Diagnosis not present

## 2015-09-11 DIAGNOSIS — I739 Peripheral vascular disease, unspecified: Secondary | ICD-10-CM | POA: Diagnosis not present

## 2015-09-13 ENCOUNTER — Ambulatory Visit (INDEPENDENT_AMBULATORY_CARE_PROVIDER_SITE_OTHER): Payer: Medicare HMO | Admitting: Obstetrics and Gynecology

## 2015-09-13 ENCOUNTER — Encounter: Payer: Self-pay | Admitting: Obstetrics and Gynecology

## 2015-09-13 VITALS — BP 155/95 | HR 53 | Ht 62.0 in | Wt 145.0 lb

## 2015-09-13 DIAGNOSIS — N189 Chronic kidney disease, unspecified: Secondary | ICD-10-CM | POA: Diagnosis not present

## 2015-09-13 DIAGNOSIS — R972 Elevated prostate specific antigen [PSA]: Secondary | ICD-10-CM

## 2015-09-13 DIAGNOSIS — R310 Gross hematuria: Secondary | ICD-10-CM

## 2015-09-13 LAB — MICROSCOPIC EXAMINATION
Bacteria, UA: NONE SEEN
EPITHELIAL CELLS (NON RENAL): NONE SEEN /HPF (ref 0–10)
RBC MICROSCOPIC, UA: NONE SEEN /HPF (ref 0–?)

## 2015-09-13 LAB — URINALYSIS, COMPLETE
BILIRUBIN UA: NEGATIVE
GLUCOSE, UA: NEGATIVE
Ketones, UA: NEGATIVE
Nitrite, UA: NEGATIVE
PROTEIN UA: NEGATIVE
RBC UA: NEGATIVE
Specific Gravity, UA: 1.02 (ref 1.005–1.030)
UUROB: 0.2 mg/dL (ref 0.2–1.0)
pH, UA: 5.5 (ref 5.0–7.5)

## 2015-09-13 NOTE — Progress Notes (Signed)
09/13/2015 7:32 AM   Albert Villarreal 03/18/1946 [id number]  Referring provider: Quinn Axe, PA-C [address] [place], [place] [address]  Chief Complaint  Patient presents with  . Elevated PSA    PSA checked 09/04/15 -9.2ng/mL    HPI: Albert Villarreal is a 69 year old African-American male presenting today as a referral for elevated PSA found on annual prostate cancer screening. He denies any urinary symptoms including dysuria, hematuria, frequency, hesitancy, sensation of incomplete bladder emptying or significant nocturia.  Patient states that he did experience gross hematuria approximately 2 years ago when he was admitted for a GI bleed. Per available notes it looks as though there were plans for patient to have a CT urogram and cystoscopy but I do not see any follow-up visits for this to be performed. He was seen by Dr. Lonna Cobb as an inpatient. Patient cannot recall his hematuria workup was ever completed.  He denies a history of prostate cancer.  PMH: Past Medical History  Diagnosis Date  . Hypertension   . Claudication of right lower extremity   . Arthritis   . Chronic kidney disease   . Kidney stones   . HTN (hypertension) 06/15/2015  . PAD (peripheral artery disease) 08/24/2015  . Chronic kidney disease, stage 3 06/15/2015    Surgical History: Past Surgical History  Procedure Laterality Date  . No past surgeries      Home Medications:    Medication List       This list is accurate as of: 09/13/15 11:59 PM.  Always use your most recent med list.               aspirin EC 81 MG tablet  Take 1 tablet (81 mg total) by mouth daily.     atorvastatin 20 MG tablet  Commonly known as:  LIPITOR  Take 1 tablet (20 mg total) by mouth daily.     lisinopril 20 MG tablet  Commonly known as:  PRINIVIL,ZESTRIL  Take 20 mg by mouth daily.        Allergies: No Known Allergies  Family History: Family History  Problem Relation Age of Onset  . Hypertension Mother    . Hypertension Father   . Prostate cancer Neg Hx   . Bladder Cancer Neg Hx   . Kidney cancer Neg Hx   . Kidney disease Neg Hx     Social History:  reports that he quit smoking about 2 years ago. His smoking use included Cigarettes. He does not have any smokeless tobacco history on file. He reports that he does not drink alcohol or use illicit drugs.  ROS: UROLOGY Frequent Urination?: No Hard to postpone urination?: No Burning/pain with urination?: No Get up at night to urinate?: No Leakage of urine?: No Urine stream starts and stops?: No Trouble starting stream?: No Do you have to strain to urinate?: No Blood in urine?: No Urinary tract infection?: No Sexually transmitted disease?: No Injury to kidneys or bladder?: No Painful intercourse?: No Weak stream?: No Erection problems?: No Penile pain?: No  Gastrointestinal Nausea?: No Vomiting?: No Indigestion/heartburn?: No Diarrhea?: No Constipation?: No  Constitutional Fever: No Night sweats?: No Weight loss?: No Fatigue?: No  Skin Skin rash/lesions?: No Itching?: No  Eyes Blurred vision?: No Double vision?: No  Ears/Nose/Throat Sore throat?: No Sinus problems?: No  Hematologic/Lymphatic Swollen glands?: No Easy bruising?: No  Cardiovascular Leg swelling?: No Chest pain?: No  Respiratory Cough?: No Shortness of breath?: No  Endocrine Excessive thirst?: No  Musculoskeletal  Back pain?: No Joint pain?: No  Neurological Headaches?: No Dizziness?: No  Psychologic Depression?: No Anxiety?: No  Physical Exam: BP 155/95 mmHg  Pulse 53  Ht 5\' 2"  (1.575 m)  Wt 145 lb (65.772 kg)  BMI 26.51 kg/m2  Constitutional:  Alert and oriented, No acute distress. HEENT: [place] AT, moist mucus membranes.  Trachea midline, no masses. Cardiovascular: No clubbing, cyanosis, or edema. Respiratory: Normal respiratory effort, no increased work of breathing. GI: Abdomen is soft, nontender, nondistended, no  abdominal masses GU: No CVA tenderness. Circumcised normal phallus, testes distended bilaterally without masses or tenderness DRE: Prostate is enlarged +2 and smooth, nontender Skin: No rashes, bruises or suspicious lesions. Lymph: No cervical or inguinal adenopathy. Neurologic: Grossly intact, no focal deficits, moving all 4 extremities. Psychiatric: Normal mood and affect.  Laboratory Data:   Urinalysis    Component Value Date/Time   COLORURINE Yellow 01/13/2014 0508   APPEARANCEUR Cloudy 01/13/2014 0508   LABSPEC 1.017 01/13/2014 0508   PHURINE 5.0 01/13/2014 0508   GLUCOSEU Negative 09/13/2015 1007   GLUCOSEU Negative 01/13/2014 0508   HGBUR Negative 01/13/2014 0508   BILIRUBINUR Negative 09/13/2015 1007   BILIRUBINUR Negative 01/13/2014 0508   KETONESUR Negative 01/13/2014 0508   PROTEINUR Negative 01/13/2014 0508   NITRITE Negative 09/13/2015 1007   NITRITE Negative 01/13/2014 0508   LEUKOCYTESUR Trace* 09/13/2015 1007   LEUKOCYTESUR Trace 01/13/2014 0508    Pertinent Imaging:  Assessment & Plan:  69 year old African-American man with elevated PSA of 9.2 without urinary symptoms.   1. Elevated PSA-   We reviewed the implications of an elevated PSA and the uncertainty surrounding it. In general, a man's PSA increases with age and is produced by both normal and cancerous prostate tissue. Differential for elevated PSA is BPH, prostate cancer, infection, recent intercourse/ejaculation, prostate infarction, recent urethroscopic manipulation (foley placement/cystoscopy) and prostatitis. Management of an elevated PSA can include observation or prostate biopsy and we discussed this in detail. We discussed that indications for prostate biopsy are defined by age and race specific PSA cutoffs as well as a PSA velocity of 0.75/year. We discussed prostate biopsy in detail including the procedure itself, the risks of blood in the urine, stool, and ejaculate, serious infection, and  discomfort. He is willing to proceed with this as discussed pending recheck of PSA today.  Recheck PSA today. Will call with results and recomendations.  - Urinalysis, Complete  2. Chronic Kidney Disease-  Will check BMP today. Per available notes creatinine levels 1.87 in 2014.  3. History of Gross Hematuria- request notes from previous Urologist Dr. Lonna Cobb.  If hematuria workup was not completed patient will also need a CT Urogram and cystoscopy.   Return for f/u 4 months recheck DRE/PSA.  These notes generated with voice recognition software. I apologize for typographical errors.  Earlie Lou, FNP  [facility] [address], [address] [place], [place] [address] [phone]

## 2015-09-14 ENCOUNTER — Encounter: Payer: Self-pay | Admitting: Obstetrics and Gynecology

## 2015-09-14 LAB — BASIC METABOLIC PANEL
BUN / CREAT RATIO: 8 — AB (ref 10–22)
BUN: 9 mg/dL (ref 8–27)
CO2: 26 mmol/L (ref 18–29)
CREATININE: 1.19 mg/dL (ref 0.76–1.27)
Calcium: 9.5 mg/dL (ref 8.6–10.2)
Chloride: 101 mmol/L (ref 97–108)
GFR calc Af Amer: 73 mL/min/{1.73_m2} (ref >59)
GFR calc non Af Amer: 63 mL/min/{1.73_m2} (ref >59)
GLUCOSE: 85 mg/dL (ref 65–99)
POTASSIUM: 4.2 mmol/L (ref 3.5–5.2)
SODIUM: 143 mmol/L (ref 134–144)

## 2015-09-14 LAB — PSA, TOTAL AND FREE
PROSTATE SPECIFIC AG, SERUM: 6.6 ng/mL — AB (ref 0.0–4.0)
PSA FREE PCT: 20.8 %
PSA FREE: 1.37 ng/mL

## 2015-09-18 ENCOUNTER — Telehealth: Payer: Self-pay | Admitting: Obstetrics and Gynecology

## 2015-09-18 NOTE — Telephone Encounter (Signed)
Spoke with patient concerning his PSA results. It is reassuring that his PSA level has gone down but is still elevated. We discussed potentially proceeding with a prostate biopsy, continued surveillance, or obtaining a 4k score. Patient opted for  4K score an appointment will be made for him to have his blood drawn. Recommendations for biopsy pending  4K score results.

## 2015-09-19 ENCOUNTER — Other Ambulatory Visit: Payer: Medicare HMO

## 2015-09-19 NOTE — Progress Notes (Signed)
Pt came in today for a 4K Score blood draw. 1 attempted to pt left hand without success. 2 tiger tubes drawn from pt left ac. Pt tolerated well. No s/s of adverse reaction noted.  Blood spun and packaged in 4K Score box. [facility] called for pick up. [id number] confirmation number.

## 2015-09-28 ENCOUNTER — Other Ambulatory Visit: Payer: Self-pay | Admitting: Obstetrics and Gynecology

## 2015-09-28 ENCOUNTER — Ambulatory Visit: Payer: Medicare HMO | Admitting: Obstetrics and Gynecology

## 2015-10-01 ENCOUNTER — Encounter: Payer: Self-pay | Admitting: Obstetrics and Gynecology

## 2015-10-01 ENCOUNTER — Ambulatory Visit (INDEPENDENT_AMBULATORY_CARE_PROVIDER_SITE_OTHER): Payer: Medicare HMO | Admitting: Obstetrics and Gynecology

## 2015-10-01 VITALS — BP 170/98 | HR 65 | Resp 16 | Ht 62.0 in | Wt 148.5 lb

## 2015-10-01 DIAGNOSIS — R310 Gross hematuria: Secondary | ICD-10-CM | POA: Diagnosis not present

## 2015-10-01 DIAGNOSIS — R972 Elevated prostate specific antigen [PSA]: Secondary | ICD-10-CM | POA: Diagnosis not present

## 2015-10-01 NOTE — Patient Instructions (Signed)

## 2015-10-01 NOTE — Progress Notes (Signed)
10/01/2015 1:25 PM   Adine Madura 12/02/1946 [id number]  Referring provider: Quinn Axe, PA-C [address] [place], [place] [address]  No chief complaint on file.   HPI: Patient is a 69 year old African-American male presenting today to review his 4k score results.  4k score 17% Intermediate Risk  09/13/15 PSA 6.2  Previous complaint history Mr. Bynum is a 69 year old African-American male presenting today as a referral for elevated PSA 9.2 found on annual prostate cancer screening. He denies any urinary symptoms including dysuria, hematuria, frequency, hesitancy, sensation of incomplete bladder emptying or significant nocturia.  Patient states that he did experience gross hematuria approximately 2 years ago when he was admitted for a GI bleed. Per available notes it looks as though there were plans for patient to have a CT urogram and cystoscopy but I do not see any follow-up visits for this to be performed. He was seen by Dr. Lonna Cobb as an inpatient. Patient cannot recall his hematuria workup was ever completed.  He denies a history of prostate cancer.   PMH: Past Medical History  Diagnosis Date  . Hypertension   . Claudication of right lower extremity (HCC)   . Arthritis   . Chronic kidney disease   . Kidney stones   . HTN (hypertension) 06/15/2015  . PAD (peripheral artery disease) (HCC) 08/24/2015  . Chronic kidney disease, stage 3 06/15/2015    Surgical History: Past Surgical History  Procedure Laterality Date  . No past surgeries      Home Medications:    Medication List       This list is accurate as of: 10/01/15  1:25 PM.  Always use your most recent med list.               aspirin EC 81 MG tablet  Take 1 tablet (81 mg total) by mouth daily.     atorvastatin 20 MG tablet  Commonly known as:  LIPITOR  Take 1 tablet (20 mg total) by mouth daily.     lisinopril 20 MG tablet  Commonly known as:  PRINIVIL,ZESTRIL  Take 20 mg by mouth  daily.        Allergies: No Known Allergies  Family History: Family History  Problem Relation Age of Onset  . Hypertension Mother   . Hypertension Father   . Prostate cancer Neg Hx   . Bladder Cancer Neg Hx   . Kidney cancer Neg Hx   . Kidney disease Neg Hx     Social History:  reports that he quit smoking about 2 years ago. His smoking use included Cigarettes. He does not have any smokeless tobacco history on file. He reports that he does not drink alcohol or use illicit drugs.  ROS: UROLOGY Frequent Urination?: No Hard to postpone urination?: No Burning/pain with urination?: No Get up at night to urinate?: No Leakage of urine?: No Urine stream starts and stops?: No Trouble starting stream?: No Do you have to strain to urinate?: No Blood in urine?: No Urinary tract infection?: No Sexually transmitted disease?: No Injury to kidneys or bladder?: No Painful intercourse?: No Weak stream?: No Erection problems?: No Penile pain?: No  Gastrointestinal Nausea?: No Vomiting?: No Indigestion/heartburn?: No Diarrhea?: No Constipation?: No  Constitutional Fever: No Night sweats?: No Weight loss?: No Fatigue?: No  Skin Skin rash/lesions?: No Itching?: No  Eyes Blurred vision?: No Double vision?: No  Ears/Nose/Throat Sore throat?: No Sinus problems?: No  Hematologic/Lymphatic Swollen glands?: No Easy bruising?: No  Cardiovascular  Leg swelling?: No Chest pain?: No  Respiratory Cough?: No Shortness of breath?: No  Endocrine Excessive thirst?: No  Musculoskeletal Back pain?: No Joint pain?: No  Neurological Headaches?: No Dizziness?: No  Psychologic Depression?: No Anxiety?: No  Physical Exam: BP 170/98 mmHg  Pulse 65  Resp 16  Ht  (1.575 m)  Wt 148 lb 8 oz (67.359 kg)  BMI 27.15 kg/m2  Constitutional:  Alert and oriented, No acute distress. HEENT: [place] AT, moist mucus membranes.  Trachea midline, no masses. Cardiovascular: No  clubbing, cyanosis, or edema. Respiratory: Normal respiratory effort, no increased work of breathing. Skin: No rashes, bruises or suspicious lesions. Neurologic: Grossly intact, no focal deficits, moving all 4 extremities. Psychiatric: Normal mood and affect.  Laboratory Data: Lab Results  Component Value Date   WBC 6.4 03/01/2014   HGB 14.4 03/01/2014   HCT 43.7 03/01/2014   MCV 93 03/01/2014   PLT 272 03/01/2014    Lab Results  Component Value Date   CREATININE 1.19 09/13/2015    Lab Results  Component Value Date   PSA 6.6* 09/13/2015    No results found for: TESTOSTERONE  No results found for: HGBA1C  Urinalysis    Component Value Date/Time   COLORURINE Yellow 01/13/2014 0508   APPEARANCEUR Cloudy 01/13/2014 0508   LABSPEC 1.017 01/13/2014 0508   PHURINE 5.0 01/13/2014 0508   GLUCOSEU Negative 09/13/2015 1007   GLUCOSEU Negative 01/13/2014 0508   HGBUR Negative 01/13/2014 0508   BILIRUBINUR Negative 09/13/2015 1007   BILIRUBINUR Negative 01/13/2014 0508   KETONESUR Negative 01/13/2014 0508   PROTEINUR Negative 01/13/2014 0508   NITRITE Negative 09/13/2015 1007   NITRITE Negative 01/13/2014 0508   LEUKOCYTESUR Trace* 09/13/2015 1007   LEUKOCYTESUR Trace 01/13/2014 0508    Pertinent Imaging:   Assessment & Plan:    69 year old African-American man with elevated PSA of 9.2 without urinary symptoms.   1. Elevated PSA-4K score demonstrating 17% likelihood of high grade prostate biopsy.  Results reviewed with patient and I recommended we proceed with prostate biopsy. We discussed prostate biopsy in detail including the procedure itself, the risks of blood in the urine, stool, and ejaculate, serious infection, and discomfort. He is willing to proceed with this as discussed. 09/13/15 PSA 6.6 08/2015 PSA  9.2 - Urinalysis, Complete  2. Chronic Kidney Disease-  01/15/14 Cr 1.87 09/13/15 Cr 1.19  3. History of Gross Hematuria- Patient reports history of gross  hematuria in 2015.  Workup was not completed as outpatient as recommended. We will schedule a CT Urogram and cystoscopy after prostate biopsy.  There are no diagnoses linked to this encounter.  Return for prostate biopsy.  Earlie Lou, FNP  [facility] [address], [address] [place], [place] [address] [phone]

## 2015-10-11 ENCOUNTER — Telehealth: Payer: Self-pay

## 2015-10-11 NOTE — Telephone Encounter (Signed)
When nurse spoke with pt in reference to 4K Score pt requested to cancel prostate bx. Nurse reinforced with pt if cancer is present then he is risking letting it go without tx. Pt voiced understanding stating he would like to continue to cancel for now.

## 2015-10-11 NOTE — Telephone Encounter (Signed)
LMOM

## 2015-10-11 NOTE — Telephone Encounter (Signed)
Spoke with pt in reference to 4K Score billing. Made pt aware Trixie DeisStephen Graham, [facility] rep, explained to me that [facility] can only bill 3x and then they have to write off the bill and the bill does not go against the pt credit. Pt voiced understanding.

## 2015-10-12 NOTE — Telephone Encounter (Signed)
So did you cancel his appt? It looks like it is still scheduled. We could change this appt with the [facility] from a procedure visit to an office visit to  further discuss his  4K results. I don't think that he understands that that his score placed him at intermediate risk.  Which is considered high enough to warrant a biopsy to be sure that he does not have high grade prostate cancer.  It is ultimately his decision but I would like to make sure that he understands his options and risks.

## 2015-10-15 NOTE — Telephone Encounter (Signed)
Spoke with pt again in reference to prostate bx. Offered pt to change procedure visit to an office visit. Pt voiced that he wants to cancel everything. Nurse reinforced with pt if he changes his mind to call us back. Pt voiced understanding.

## 2015-10-23 ENCOUNTER — Other Ambulatory Visit: Payer: Medicare HMO

## 2015-11-20 ENCOUNTER — Ambulatory Visit (INDEPENDENT_AMBULATORY_CARE_PROVIDER_SITE_OTHER): Payer: Medicare HMO | Admitting: Cardiovascular Disease

## 2015-11-20 ENCOUNTER — Other Ambulatory Visit: Payer: Self-pay

## 2015-11-20 ENCOUNTER — Encounter: Payer: Self-pay | Admitting: Cardiovascular Disease

## 2015-11-20 VITALS — BP 110/80 | HR 72 | Ht 62.0 in | Wt 149.8 lb

## 2015-11-20 DIAGNOSIS — I10 Essential (primary) hypertension: Secondary | ICD-10-CM | POA: Diagnosis not present

## 2015-11-20 DIAGNOSIS — E785 Hyperlipidemia, unspecified: Secondary | ICD-10-CM

## 2015-11-20 DIAGNOSIS — I739 Peripheral vascular disease, unspecified: Secondary | ICD-10-CM

## 2015-11-20 MED ORDER — CILOSTAZOL 50 MG PO TABS: 50.0000 mg | ORAL_TABLET | Freq: Two times a day (BID) | ORAL | Status: DC

## 2015-11-20 NOTE — Progress Notes (Signed)
Primary care physician: Dr. Ferdie Ping  HPI  This is a pleasant 69 year old African-American male who is here today for a follow-up visit regarding peripheral arterial disease. The patient has known history of hypertension and previous tobacco use. He quit smoking in 2014. He is not diabetic. 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 The patient required transfusion. EGD showed gastritis. He was treated with IV steroids with improvement in platelet count. He was seen recently for severe bilateral calf claudication. ABI was moderately reduced bilaterally. Duplex showed evidence of bilateral SFA occlusion bilaterally. I instructed him to start a walking exercise program and he has been doing that daily now. He reports significant improvement in claudication gradually and currently he does not feel limited by it. He did not have to stop while walking from the parking lot to our office. He reports that he had to stop 3 times last time.   No Known Allergies   Current Outpatient Prescriptions on File Prior to Visit  Medication Sig Dispense Refill  . aspirin EC 81 MG tablet Take 1 tablet (81 mg total) by mouth daily. 90 tablet 0  . atorvastatin (LIPITOR) 20 MG tablet Take 1 tablet (20 mg total) by mouth daily. 30 tablet 5  . lisinopril (PRINIVIL,ZESTRIL) 20 MG tablet Take 20 mg by mouth daily.     No current facility-administered medications on file prior to visit.     Past Medical History  Diagnosis Date  . Hypertension   . Claudication of right lower extremity (HCC)   . Arthritis   . Chronic kidney disease   . Kidney stones   . HTN (hypertension) 06/15/2015  . PAD (peripheral artery disease) (HCC) 08/24/2015  . Chronic kidney disease, stage 3 06/15/2015     Past Surgical History  Procedure Laterality Date  . No past surgeries       Family History  Problem Relation Age of Onset  . Hypertension Mother    . Hypertension Father   . Prostate cancer Neg Hx   . Bladder Cancer Neg Hx   . Kidney cancer Neg Hx   . Kidney disease Neg Hx      Social History   Social History  . Marital Status: Single    Spouse Name: N/A  . Number of Children: N/A  . Years of Education: N/A   Occupational History  . Not on file.   Social History Main Topics  . Smoking status: Former Smoker    Types: Cigarettes    Quit date: 12/29/2012  . Smokeless tobacco: Not on file  . Alcohol Use: No  . Drug Use: No  . Sexual Activity: Not on file   Other Topics Concern  . Not on file   Social History Narrative     ROS A 10 point review of system was performed. It is negative other than that mentioned in the history of present illness.   PHYSICAL EXAM   BP 110/80 mmHg  Pulse 72  Ht  (1.575 m)  Wt 149 lb 12 oz (67.926 kg)  BMI 27.38 kg/m2 Constitutional: She is oriented to person, place, and time. She appears well-developed and well-nourished. No distress.  HENT: No nasal discharge.  Head: Normocephalic and atraumatic.  Eyes: Pupils are equal and round. No discharge.  Neck: Normal range of motion. Neck supple. No JVD present. No thyromegaly present.  Cardiovascular: Normal rate, regular rhythm, normal heart sounds. Exam reveals no  gallop and no friction rub. No murmur heard.  Pulmonary/Chest: Effort normal and breath sounds normal. No stridor. No respiratory distress. She has no wheezes. She has no rales. She exhibits no tenderness.  Abdominal: Soft. Bowel sounds are normal. She exhibits no distension. There is no tenderness. There is no rebound and no guarding.  Musculoskeletal: Normal range of motion. She exhibits no edema and no tenderness.  Neurological: She is alert and oriented to person, place, and time. Coordination normal.  Skin: Skin is warm and dry. No rash noted. She is not diaphoretic. No erythema. No pallor.  Psychiatric: She has a normal mood and affect. Her behavior is normal.  Judgment and thought content normal.  Vascular: Femoral pulses are normal. Distal pulses are not palpable.      ASSESSMENT AND PLAN

## 2015-11-20 NOTE — Assessment & Plan Note (Signed)
The patient initially had severe bilateral calf claudication which improved significantly with a walking program. Duplex showed occluded SFA bilaterally and ABI was moderately reduced. Given the improvement in his symptoms, no need to proceed with angiography at the present time. I elected to add cilostazol to see if we can improve his claudication even more. He has no symptoms suggestive of ischemic heart disease.

## 2015-11-20 NOTE — Assessment & Plan Note (Signed)
Lab Results  Component Value Date   CHOL 160 08/24/2015   HDL 36* 08/24/2015   LDLCALC 106* 08/24/2015   TRIG 88 08/24/2015   CHOLHDL 4.4 08/24/2015   He was started on atorvastatin 20 mg once daily. He will require a fasting lipid and liver profile if not already done.

## 2015-11-20 NOTE — Patient Instructions (Addendum)
Medication Instructions:  Your physician has recommended you make the following change in your medication:  START taking Pletal 50mg  twice per day   Labwork: none  Testing/Procedures: none  Follow-Up: Your physician wants you to follow-up in: six months with Dr. Kirke CorinArida.  You will receive a reminder letter in the mail two months in advance. If you don't receive a letter, please call our office to schedule the follow-up appointment.   Any Other Special Instructions Will Be Listed Below (If Applicable).     If you need a refill on your cardiac medications before your next appointment, please call your pharmacy.  Cilostazol tablets What is this medicine? CILOSTAZOL (sil [place] sta zol) is used to treat the symptoms of intermittent claudication. This condition causes pain in the legs during walking, and goes away with rest. By improving blood flow, this medicine helps people with this condition walk longer distances without pain. This medicine may be used for other purposes; ask your health care provider or pharmacist if you have questions. What should I tell my health care provider before I take this medicine? They need to know if you have any of the following conditions: -bleeding disorder or hemophilia -history of heart failure, heart attack, or other heart disease -an unusual or allergic reaction to cilostazol, other medicines, foods, dyes, or preservatives -pregnant or trying to get pregnant -breast-feeding How should I use this medicine? Take this medicine by mouth with a full glass of water. Follow the directions on the prescription label. Take this medicine on an empty stomach, at least 30 minutes before or 2 hours after food. Do not take with food. Take your doses at regular intervals. Do not take your medicine more often than directed. Talk to your pediatrician regarding the use of this medicine in children. Special care may be needed. Overdosage: If you think you have taken too  much of this medicine contact a poison control center or emergency room at once. NOTE: This medicine is only for you. Do not share this medicine with others. What if I miss a dose? If you miss a dose, take it as soon as you can. If it is almost time for your next dose, take only that dose. Do not take double or extra doses. What may interact with this medicine? Do not take this medicine with any of the following medications: -grapefruit juice This medicine may also interact with the following medications: -agents that prevent or treat blood clots like enoxaparin or warfarin -aspirin -diltiazem -erythromycin or clarithromycin -omeprazole -some medications for treating depression like fluoxetine, fluvoxamine, nefazodone -some medications for treating fungal infections like ketoconazole, fluconazole, itraconazole This list may not describe all possible interactions. Give your health care provider a list of all the medicines, herbs, non-prescription drugs, or dietary supplements you use. Also tell them if you smoke, drink alcohol, or use illegal drugs. Some items may interact with your medicine. What should I watch for while using this medicine? Visit your doctor or health care professional for regular checks on your progress. It may take 2 to 4 weeks for your condition to start to get better once you begin taking this medicine. In some people, it can take as long as 3 months for the condition to get better. You may get drowsy or dizzy. Do not drive, use machinery, or do anything that needs mental alertness until you know how this drug affects you. Do not stand or sit up quickly, especially if you are an older patient. This reduces  the risk of dizzy or fainting spells. Alcohol can make you more drowsy and dizzy. Avoid alcoholic drinks. Smoking may have effects on the circulation that may limit the benefits you receive from this medicine. You may wish to discuss how to stop smoking with your doctor or  health care professional. If you are going to have surgery, tell your doctor or health care professional that you are taking this medicine. What side effects may I notice from receiving this medicine? Side effects that you should report to your doctor or health care professional as soon as possible: -allergic reactions like skin rash, itching or hives, swelling of the face, lips, or tongue -chest pain -fast, slow, or irregular heartbeat -signs and symptoms of bleeding such as bloody or black, tarry stools; red or dark-brown urine; spitting up blood or brown material that looks like coffee grounds; red spots on the skin; unusual bruising or bleeding from the eye, gums, or nose -swelling in the legs or ankles Side effects that usually do not require medical attention (report to your doctor or health care professional if they continue or are bothersome): -diarrhea -headache -nausea, or upset stomach This list may not describe all possible side effects. Call your doctor for medical advice about side effects. You may report side effects to FDA at 1-800-FDA-1088. Where should I keep my medicine? Keep out of the reach of children. Store at room temperature between 15 and 30 degrees C (59 and 86 degrees F). Throw away any unused medicine after the expiration date. NOTE: This sheet is a summary. It may not cover all possible information. If you have questions about this medicine, talk to your doctor, pharmacist, or health care provider.    2016, Elsevier/Gold Standard. (2013-04-07 15:00:52)

## 2015-11-20 NOTE — Assessment & Plan Note (Signed)
Blood pressure is controlled on lisinopril. 

## 2015-11-21 ENCOUNTER — Other Ambulatory Visit: Payer: Medicare HMO

## 2015-12-19 ENCOUNTER — Encounter: Payer: Self-pay | Admitting: General Practice

## 2016-01-15 ENCOUNTER — Ambulatory Visit: Payer: Medicare HMO | Admitting: Obstetrics and Gynecology

## 2016-01-15 ENCOUNTER — Encounter: Payer: Self-pay | Admitting: Obstetrics and Gynecology

## 2016-04-22 DIAGNOSIS — Z5320 Procedure and treatment not carried out because of patient's decision for unspecified reasons: Secondary | ICD-10-CM | POA: Insufficient documentation

## 2016-10-17 ENCOUNTER — Encounter: Payer: Self-pay | Admitting: Urology

## 2016-10-17 ENCOUNTER — Ambulatory Visit (INDEPENDENT_AMBULATORY_CARE_PROVIDER_SITE_OTHER): Payer: Medicare HMO | Admitting: Urology

## 2016-10-17 VITALS — BP 132/84 | HR 54 | Ht 62.0 in | Wt 139.1 lb

## 2016-10-17 DIAGNOSIS — R972 Elevated prostate specific antigen [PSA]: Secondary | ICD-10-CM

## 2016-10-17 DIAGNOSIS — N400 Enlarged prostate without lower urinary tract symptoms: Secondary | ICD-10-CM | POA: Diagnosis not present

## 2016-10-17 NOTE — Progress Notes (Signed)
10/17/2016 8:50 AM   Albert Villarreal 03/19/1946 [id number]  Referring provider: Quinn Axe, PA-C [address] [place], [place] [address]  Chief Complaint  Patient presents with  . Elevated PSA    Follow up    HPI: The patient is a 70 year old gentleman for follow up of elevated PSA. His PSA 9.2 in September 2016. Repeat PSA was 6.2. He underwent a 4K score which showed intermediate risk of 17% for Gleason 7 or higher prostate cancer. The patient was scheduled to undergo a prostate biopsy but canceled this appointment due to financial constraints. He has no changes in his status since last year. He denies any urinary symptoms at this time.   PMH: Past Medical History:  Diagnosis Date  . Arthritis   . Chronic kidney disease   . Chronic kidney disease, stage 3 06/15/2015  . Claudication of right lower extremity (HCC)   . HTN (hypertension) 06/15/2015  . Hypertension   . Kidney stones   . PAD (peripheral artery disease) (HCC) 08/24/2015    Surgical History: Past Surgical History:  Procedure Laterality Date  . NO PAST SURGERIES      Home Medications:    Medication List       Accurate as of 10/17/16  8:50 AM. Always use your most recent med list.          aspirin EC 81 MG tablet Take 1 tablet (81 mg total) by mouth daily.   atorvastatin 20 MG tablet Commonly known as:  LIPITOR Take 1 tablet (20 mg total) by mouth daily.   cilostazol 50 MG tablet Commonly known as:  PLETAL Take 1 tablet (50 mg total) by mouth 2 (two) times daily.   lisinopril 20 MG tablet Commonly known as:  PRINIVIL,ZESTRIL Take 20 mg by mouth daily.       Allergies: No Known Allergies  Family History: Family History  Problem Relation Age of Onset  . Hypertension Mother   . Hypertension Father   . Prostate cancer Neg Hx   . Bladder Cancer Neg Hx   . Kidney cancer Neg Hx   . Kidney disease Neg Hx     Social History:  reports that he quit smoking about 3 years ago. His  smoking use included Cigarettes. He has never used smokeless tobacco. He reports that he does not drink alcohol or use drugs.  ROS: UROLOGY Frequent Urination?: No Hard to postpone urination?: No Burning/pain with urination?: No Get up at night to urinate?: No Leakage of urine?: No Urine stream starts and stops?: No Trouble starting stream?: No Do you have to strain to urinate?: No Blood in urine?: No Urinary tract infection?: No Sexually transmitted disease?: No Injury to kidneys or bladder?: No Painful intercourse?: No Weak stream?: No Erection problems?: No Penile pain?: No  Gastrointestinal Nausea?: No Vomiting?: No Indigestion/heartburn?: No Diarrhea?: No Constipation?: No  Constitutional Fever: No Night sweats?: No Weight loss?: No Fatigue?: No  Skin Skin rash/lesions?: No Itching?: No  Eyes Blurred vision?: No Double vision?: No  Ears/Nose/Throat Sore throat?: No Sinus problems?: No  Hematologic/Lymphatic Swollen glands?: No Easy bruising?: No  Cardiovascular Leg swelling?: No Chest pain?: No  Respiratory Cough?: No Shortness of breath?: No  Endocrine Excessive thirst?: No  Musculoskeletal Back pain?: No Joint pain?: No  Neurological Headaches?: No Dizziness?: No  Psychologic Depression?: Yes Anxiety?: Yes  Physical Exam: BP 132/84 (BP Location: Left Arm, Patient Position: Sitting, Cuff Size: Normal)   Pulse (!) 54   Ht  5\' 2"  (1.575 m)   Wt 139 lb 1.6 oz (63.1 kg)   BMI 25.44 kg/m   Constitutional:  Alert and oriented, No acute distress. HEENT: [place] AT, moist mucus membranes.  Trachea midline, no masses. Cardiovascular: No clubbing, cyanosis, or edema. Respiratory: Normal respiratory effort, no increased work of breathing. GI: Abdomen is soft, nontender, nondistended, no abdominal masses GU: No CVA tenderness. DRE: 2+ smooth benign Skin: No rashes, bruises or suspicious lesions. Lymph: No cervical or inguinal  adenopathy. Neurologic: Grossly intact, no focal deficits, moving all 4 extremities. Psychiatric: Normal mood and affect.  Laboratory Data: Lab Results  Component Value Date   WBC 6.4 03/01/2014   HGB 14.4 03/01/2014   HCT 43.7 03/01/2014   MCV 93 03/01/2014   PLT 272 03/01/2014    Lab Results  Component Value Date   CREATININE 1.19 09/13/2015    No results found for: PSA  No results found for: TESTOSTERONE  No results found for: HGBA1C  Urinalysis    Component Value Date/Time   COLORURINE Yellow 01/13/2014 0508   APPEARANCEUR Clear 09/13/2015 1007   LABSPEC 1.017 01/13/2014 0508   PHURINE 5.0 01/13/2014 0508   GLUCOSEU Negative 09/13/2015 1007   GLUCOSEU Negative 01/13/2014 0508   HGBUR Negative 01/13/2014 0508   BILIRUBINUR Negative 09/13/2015 1007   BILIRUBINUR Negative 01/13/2014 0508   KETONESUR Negative 01/13/2014 0508   PROTEINUR Negative 09/13/2015 1007   PROTEINUR Negative 01/13/2014 0508   NITRITE Negative 09/13/2015 1007   NITRITE Negative 01/13/2014 0508   LEUKOCYTESUR Trace (A) 09/13/2015 1007   LEUKOCYTESUR Trace 01/13/2014 0508    Assessment & Plan:    1. Elevated PSA I again discussed the patient is elevated PSA as well as his intermediate risk 4K score. We discussed the next step would be a prostate biopsy. We discussed the risks, benefits, and indications for this procedure. He understands the risks include but limited to bleeding and infection. He is no sixpack blood in the stool, semen, and urine. He understands maybe blood in his semen for up to 6 weeks. Also since the risk of infection that is approximately 1% to require IV antibiotics in the hospital. All questions were answered. The patient will follow-up for prostate biopsy.  We did also repeat his PSA today for staging purposes. In the unlikely event, his PSA is lower he can cancel his biopsy. But again this is very unlikely.  Return in 2 weeks (on 10/31/2016) for prostate biopsy.  Hildred LaserBrian  James Humzah Harty, MD  [facility] [address], [address] [place], [place] [address] [phone]

## 2016-10-18 LAB — PSA: PROSTATE SPECIFIC AG, SERUM: 8 ng/mL — AB (ref 0.0–4.0)

## 2016-10-20 ENCOUNTER — Other Ambulatory Visit: Payer: Self-pay

## 2016-10-21 ENCOUNTER — Telehealth: Payer: Self-pay | Admitting: Cardiovascular Disease

## 2016-10-21 NOTE — Telephone Encounter (Signed)
Request to hold pletal and ASA 7 days prior to prostate biopsy received from PAT. Placed in Dr. Jari SportsmanArida's basket.

## 2016-10-27 ENCOUNTER — Telehealth: Payer: Self-pay | Admitting: Urology

## 2016-10-27 NOTE — Telephone Encounter (Signed)
patient called and cx his biopsy. I tried to talk him out of it. His PSA is 8.0 he said he just does not want to have it done.  Did not want to reschd it.   Albert DusterMichelle

## 2016-10-30 ENCOUNTER — Encounter: Payer: Self-pay | Admitting: Cardiovascular Disease

## 2016-10-30 ENCOUNTER — Ambulatory Visit (INDEPENDENT_AMBULATORY_CARE_PROVIDER_SITE_OTHER): Payer: Medicare HMO | Admitting: Cardiovascular Disease

## 2016-10-30 VITALS — BP 100/64 | HR 72 | Ht 61.0 in | Wt 138.5 lb

## 2016-10-30 DIAGNOSIS — I10 Essential (primary) hypertension: Secondary | ICD-10-CM | POA: Diagnosis not present

## 2016-10-30 DIAGNOSIS — I739 Peripheral vascular disease, unspecified: Secondary | ICD-10-CM

## 2016-10-30 DIAGNOSIS — E7800 Pure hypercholesterolemia, unspecified: Secondary | ICD-10-CM | POA: Diagnosis not present

## 2016-10-30 NOTE — Telephone Encounter (Signed)
Faxed completed request to PAT @ [phone]

## 2016-10-30 NOTE — Patient Instructions (Signed)
Medication Instructions: Continue same medications.   Labwork: None.   Procedures/Testing: None.   Follow-Up: 1 year with Dr. Arida.   Any Additional Special Instructions Will Be Listed Below (If Applicable).     If you need a refill on your cardiac medications before your next appointment, please call your pharmacy.   

## 2016-10-30 NOTE — Progress Notes (Signed)
Cardiology Office Note   Date:  10/30/2016   ID:  Albert Madurahomas Roehm, DOB 1946-02-16, MRN [id number]  PCP:  Quinn AxeOBERTSON, ANTHONY T, PA-C  Cardiologist:   Lorine BearsMuhammad Arida, MD   Chief Complaint  Patient presents with  . other    6 month follow up. Meds reviewed by the pt. verbally. "doing well."       History of Present Illness: Albert Villarreal is a 70 y.o. male who presents for a follow-up visit regarding peripheral arterial disease. The patient has known history of hypertension and previous tobacco use. He quit smoking in 2014. He is not diabetic. 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 The patient required transfusion. EGD showed gastritis. He was treated with IV steroids with improvement in platelet count. 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  his symptoms improved with a walking program and cilostazol and overall he has been stable. He denies any chest pain or shortness of breath. He was recently found to have elevated PSA. Prostate biopsy was recommended. The patient does not want to go through that.  Past Medical History:  Diagnosis Date  . Arthritis   . Chronic kidney disease   . Chronic kidney disease, stage 3 06/15/2015  . Claudication of right lower extremity (HCC)   . HTN (hypertension) 06/15/2015  . Hypertension   . Kidney stones   . PAD (peripheral artery disease) (HCC) 08/24/2015    Past Surgical History:  Procedure Laterality Date  . NO PAST SURGERIES       Current Outpatient Prescriptions  Medication Sig Dispense Refill  . aspirin EC 81 MG tablet Take 1 tablet (81 mg total) by mouth daily. 90 tablet 0  . atorvastatin (LIPITOR) 20 MG tablet Take 1 tablet (20 mg total) by mouth daily. 30 tablet 5  . cilostazol (PLETAL) 50 MG tablet Take 1 tablet (50 mg total) by mouth 2 (two) times daily. 60 tablet 6  .  lisinopril (PRINIVIL,ZESTRIL) 20 MG tablet Take 20 mg by mouth daily.     No current facility-administered medications for this visit.     Allergies:   Review of patient's allergies indicates no known allergies.    Social History:  The patient  reports that he quit smoking about 3 years ago. His smoking use included Cigarettes. He has never used smokeless tobacco. He reports that he does not drink alcohol or use drugs.   Family History:  The patient's family history includes Hypertension in his father and mother.    ROS:  Please see the history of present illness.   Otherwise, review of systems are positive for none.   All other systems are reviewed and negative.    PHYSICAL EXAM: VS:  BP 100/64 (BP Location: Left Arm, Patient Position: Sitting, Cuff Size: Normal)   Pulse 72   Ht 5\' 1"  (1.549 m)   Wt 138 lb 8 oz (62.8 kg)   BMI 26.17 kg/m  , BMI Body mass index is 26.17 kg/m. GEN: Well nourished, well developed, in no acute distress  HEENT: normal  Neck: no JVD, carotid bruits, or masses Cardiac: RRR; no murmurs, rubs, or gallops,no edema  Respiratory:  clear to auscultation bilaterally, normal work of breathing GI: soft, nontender, nondistended, + BS MS: no deformity or atrophy  Skin: warm and dry, no rash Neuro:  Strength and sensation are intact Psych: euthymic  mood, full affect   EKG:  EKG is ordered today. The ekg ordered today demonstrates normal sinus rhythm with no significant ST or T wave changes.   Recent Labs: No results found for requested labs within last 8760 hours.    Lipid Panel    Component Value Date/Time   CHOL 160 08/24/2015 1155   TRIG 88 08/24/2015 1155   HDL 36 (L) 08/24/2015 1155   CHOLHDL 4.4 08/24/2015 1155   VLDL 18 08/24/2015 1155   LDLCALC 106 (H) 08/24/2015 1155      Wt Readings from Last 3 Encounters:  10/30/16 138 lb 8 oz (62.8 kg)  10/17/16 139 lb 1.6 oz (63.1 kg)  11/20/15 149 lb 12 oz (67.9 kg)       No flowsheet data  found.    ASSESSMENT AND PLAN:  1.  Peripheral arterial disease: Mild to moderate bilateral calf claudication which is currently not lifestyle limiting. Given that he is not diabetic and he quit smoking, the chance of progression is overall small. I recommend continuing medical therapy.  2. Preoperative cardiovascular evaluation: Prostate biopsy has been recommended. However, the patient does not want to go through it. If he decides to proceed, he is considered at low risk from a cardiac standpoint with no need for ischemic workup given his good functional capacity and no symptoms. Aspirin and cilostazol can be held 7 days before procedure if needed.  3. Hyperlipidemia: I reviewed his most recent lipid profile which showed excellent control with an LDL of 48. Continue treatment with atorvastatin.  4. Essential hypertension: Blood pressure is controlled on lisinopril.    Disposition:   FU with me in 1 year  Signed,  Lorine BearsMuhammad Arida, MD  10/30/2016 2:26 PM    [facility]

## 2016-11-04 ENCOUNTER — Other Ambulatory Visit: Payer: Medicare HMO

## 2019-05-19 ENCOUNTER — Telehealth: Payer: Self-pay | Admitting: Cardiovascular Disease

## 2019-05-19 NOTE — Telephone Encounter (Signed)
Virtual Visit Pre-Appointment Phone Call  "(Name), I am calling you today to discuss your upcoming appointment. We are currently trying to limit exposure to the virus that causes COVID-19 by seeing patients at home rather than in the office."  1. "What is the BEST phone number to call the day of the visit?" - include this in appointment notes  2. "Do you have or have access to (through a family member/friend) a smartphone with video capability that we can use for your visit?" a. If yes - list this number in appt notes as "cell" (if different from BEST phone #) and list the appointment type as a VIDEO visit in appointment notes b. If no - list the appointment type as a PHONE visit in appointment notes  3. Confirm consent - "In the setting of the current Covid19 crisis, you are scheduled for a (phone or video) visit with your provider on (date) at (time).  Just as we do with many in-office visits, in order for you to participate in this visit, we must obtain consent.  If you'd like, I can send this to your mychart (if signed up) or email for you to review.  Otherwise, I can obtain your verbal consent now.  All virtual visits are billed to your insurance company just like a normal visit would be.  By agreeing to a virtual visit, we'd like you to understand that the technology does not allow for your provider to perform an examination, and thus may limit your provider's ability to fully assess your condition. If your provider identifies any concerns that need to be evaluated in person, we will make arrangements to do so.  Finally, though the technology is pretty good, we cannot assure that it will always work on either your or our end, and in the setting of a video visit, we may have to convert it to a phone-only visit.  In either situation, we cannot ensure that we have a secure connection.  Are you willing to proceed?" STAFF: Did the patient verbally acknowledge consent to telehealth visit? Document  YES/NO here: YES  4. Advise patient to be prepared - "Two hours prior to your appointment, go ahead and check your blood pressure, pulse, oxygen saturation, and your weight (if you have the equipment to check those) and write them all down. When your visit starts, your provider will ask you for this information. If you have an Apple Watch or Kardia device, please plan to have heart rate information ready on the day of your appointment. Please have a pen and paper handy nearby the day of the visit as well."  5. Give patient instructions for MyChart download to smartphone OR Doximity/Doxy.me as below if video visit (depending on what platform provider is using)  6. Inform patient they will receive a phone call 15 minutes prior to their appointment time (may be from unknown caller ID) so they should be prepared to answer    TELEPHONE CALL NOTE  Albert Villarreal has been deemed a candidate for a follow-up tele-health visit to limit community exposure during the Covid-19 pandemic. I spoke with the patient via phone to ensure availability of phone/video source, confirm preferred email & phone number, and discuss instructions and expectations.  I reminded Albert Villarreal to be prepared with any vital sign and/or heart rhythm information that could potentially be obtained via home monitoring, at the time of his visit. I reminded Albert Villarreal to expect a phone call prior to his visit.  [name] 05/19/2019 9:48 AM   INSTRUCTIONS FOR DOWNLOADING THE MYCHART APP TO SMARTPHONE  - The patient must first make sure to have activated MyChart and know their login information - If Apple, go to [employer] and type in MyChart in the search bar and download the app. If Android, ask patient to go to [employer] and type in [place] in the search bar and download the app. The app is free but as with any other app downloads, their phone may require them to verify saved payment information or Apple/Android  password.  - The patient will need to then log into the app with their MyChart username and password, and select [facility] as their healthcare provider to link the account. When it is time for your visit, go to the MyChart app, find appointments, and click Begin Video Visit. Be sure to Select Allow for your device to access the Microphone and Camera for your visit. You will then be connected, and your provider will be with you shortly.  **If they have any issues connecting, or need assistance please contact MyChart service desk [phone])**  **If using a computer, in order to ensure the best quality for their visit they will need to use either of the following Internet Browsers: [employer] or Google Chrome**  IF USING DOXIMITY or DOXY.ME - The patient will receive a link just prior to their visit by text.     FULL LENGTH CONSENT FOR TELE-HEALTH VISIT   I hereby voluntarily request, consent and authorize [facility] and its employed or contracted physicians, physician assistants, nurse practitioners or other licensed health care professionals (the Practitioner), to provide me with telemedicine health care services (the "Services") as deemed necessary by the treating Practitioner. I acknowledge and consent to receive the Services by the Practitioner via telemedicine. I understand that the telemedicine visit will involve communicating with the Practitioner through live audiovisual communication technology and the disclosure of certain medical information by electronic transmission. I acknowledge that I have been given the opportunity to request an in-person assessment or other available alternative prior to the telemedicine visit and am voluntarily participating in the telemedicine visit.  I understand that I have the right to withhold or withdraw my consent to the use of telemedicine in the course of my care at any time, without affecting my right to future care or treatment,  and that the Practitioner or I may terminate the telemedicine visit at any time. I understand that I have the right to inspect all information obtained and/or recorded in the course of the telemedicine visit and may receive copies of available information for a reasonable fee.  I understand that some of the potential risks of receiving the Services via telemedicine include:  Marland Kitchen Delay or interruption in medical evaluation due to technological equipment failure or disruption; . Information transmitted may not be sufficient (e.g. poor resolution of images) to allow for appropriate medical decision making by the Practitioner; and/or  . In rare instances, security protocols could fail, causing a breach of personal health information.  Furthermore, I acknowledge that it is my responsibility to provide information about my medical history, conditions and care that is complete and accurate to the best of my ability. I acknowledge that Practitioner's advice, recommendations, and/or decision may be based on factors not within their control, such as incomplete or inaccurate data provided by me or distortions of diagnostic images or specimens that may result from electronic transmissions. I understand that the practice  of medicine is not an Chief Strategy Officer and that Practitioner makes no warranties or guarantees regarding treatment outcomes. I acknowledge that I will receive a copy of this consent concurrently upon execution via email to the email address I last provided but may also request a printed copy by calling the office of [place].    I understand that my insurance will be billed for this visit.   I have read or had this consent read to me. . I understand the contents of this consent, which adequately explains the benefits and risks of the Services being provided via telemedicine.  . I have been provided ample opportunity to ask questions regarding this consent and the Services and have had my questions  answered to my satisfaction. . I give my informed consent for the services to be provided through the use of telemedicine in my medical care  By participating in this telemedicine visit I agree to the above.

## 2019-05-20 ENCOUNTER — Encounter: Payer: Self-pay | Admitting: Cardiovascular Disease

## 2019-05-20 ENCOUNTER — Other Ambulatory Visit: Payer: Self-pay

## 2019-05-20 ENCOUNTER — Telehealth (INDEPENDENT_AMBULATORY_CARE_PROVIDER_SITE_OTHER): Payer: Medicare HMO | Admitting: Cardiovascular Disease

## 2019-05-20 VITALS — Ht 62.0 in | Wt 132.0 lb

## 2019-05-20 DIAGNOSIS — E785 Hyperlipidemia, unspecified: Secondary | ICD-10-CM

## 2019-05-20 DIAGNOSIS — I739 Peripheral vascular disease, unspecified: Secondary | ICD-10-CM

## 2019-05-20 NOTE — Patient Instructions (Addendum)
Medication Instructions:  Continue same medications If you need a refill on your cardiac medications before your next appointment, please call your pharmacy.   Lab work: Lipid and liver profile with next visit in 3 months If you have labs (blood work) drawn today and your tests are completely normal, you will receive your results only by: Marland Kitchen MyChart Message (if you have MyChart) OR . A paper copy in the mail If you have any lab test that is abnormal or we need to change your treatment, we will call you to review the results.  Testing/Procedures: Your physician has requested that you have a lower extremity segmental doppler in 3 months. This will take place at [address], the [facility] office.   Follow-Up: At [facility], you and your health needs are our priority.  As part of our continuing mission to provide you with exceptional heart care, we have created designated Provider Care Teams.  These Care Teams include your primary Cardiologist (physician) and Advanced Practice Providers (APPs -  Physician Assistants and Nurse Practitioners) who all work together to provide you with the care you need, when you need it. You will need a follow up appointment in 3 months.  Please call our office 2 months in advance to schedule this appointment.  You may see Lorine Bears, MD or one of the following Advanced Practice Providers on your designated Care Team:   Nicolasa Ducking, NP Eula Listen, PA-C . Marisue Ivan, PA-C

## 2019-05-20 NOTE — Addendum Note (Signed)
Addended by: Sandi Mariscal on: 05/20/2019 10:14 AM   Modules accepted: Orders

## 2019-05-20 NOTE — Progress Notes (Signed)
Virtual Visit via Telephone Note   This visit type was conducted due to national recommendations for restrictions regarding the COVID-19 Pandemic (e.g. social distancing) in an effort to limit this patient's exposure and mitigate transmission in our community.  Due to his co-morbid illnesses, this patient is at least at moderate risk for complications without adequate follow up.  This format is felt to be most appropriate for this patient at this time.  The patient did not have access to video technology/had technical difficulties with video requiring transitioning to audio format only (telephone).  All issues noted in this document were discussed and addressed.  No physical exam could be performed with this format.  Please refer to the patient's chart for his  consent [facility].   Date:  05/20/2019   ID:  Albert Villarreal, DOB [birth year], MRN [id number]  Patient Location: Home Provider Location: Home  PCP:  Quinn Axeobertson, Anthony T, PA-C  Cardiologist:  Lorine BearsMuhammad Gia Lusher, MD  Electrophysiologist:  None   Evaluation Performed:  Follow-Up Visit  Chief Complaint: No complaints today  History of Present Illness:    Albert Villarreal is a 73 y.o. male was reached today via phone for follow-up visit regarding peripheral arterial disease.  He has not been seen by me since November 2017. The patient has known history of hypertension, hyperlipidemia and  tobacco use. He is not diabetic.  He previously quit smoking in 2014 but unfortunately he relapsed and currently smokes at least 1 pack/day. 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 The patient required transfusion. EGD showed gastritis. He was treated with IV steroids with improvement in platelet count. 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  his symptoms improved with a walking  program and cilostazol and overall he has been stable.  He had elevated PSA in the past but declined further evaluation.  He has been doing reasonably well with no chest pain or shortness of breath.  He reports slight worsening of bilateral leg claudication mainly in his calves especially after he walks uphill.  No rest pain or lower extremity ulceration.  The patient does not have symptoms concerning for COVID-19 infection (fever, chills, cough, or new shortness of breath).    Past Medical History:  Diagnosis Date  . Arthritis   . Chronic kidney disease   . Chronic kidney disease, stage 3 (HCC) 06/15/2015  . Claudication of right lower extremity (HCC)   . HTN (hypertension) 06/15/2015  . Hypertension   . Kidney stones   . PAD (peripheral artery disease) (HCC) 08/24/2015   Past Surgical History:  Procedure Laterality Date  . NO PAST SURGERIES       Current Meds  Medication Sig  . aspirin EC 81 MG tablet Take 1 tablet (81 mg total) by mouth daily.  Marland Kitchen. atorvastatin (LIPITOR) 20 MG tablet Take 1 tablet (20 mg total) by mouth daily.  . cilostazol (PLETAL) 50 MG tablet Take 1 tablet (50 mg total) by mouth 2 (two) times daily.  Marland Kitchen. lisinopril (PRINIVIL,ZESTRIL) 20 MG tablet Take 20 mg by mouth daily.     Allergies:   Patient has no known allergies.   Social History   Tobacco Use  . Smoking status: Former Smoker    Types: Cigarettes    Last attempt to quit: 12/29/2012    Years since quitting: 6.3  . Smokeless tobacco:  Never Used  Substance Use Topics  . Alcohol use: No    Alcohol/week: 0.0 standard drinks  . Drug use: No     Family Hx: The patient's family history includes Hypertension in his father and mother. There is no history of Prostate cancer, Bladder Cancer, Kidney cancer, or Kidney disease.  ROS:   Please see the history of present illness.     All other systems reviewed and are negative.   Prior CV studies:   The following studies were reviewed today:     Labs/Other Tests and Data Reviewed:    EKG:  No ECG reviewed.  Recent Labs: No results found for requested labs within last 8760 hours.   Recent Lipid Panel Lab Results  Component Value Date/Time   CHOL 160 08/24/2015 11:55 AM   TRIG 88 08/24/2015 11:55 AM   HDL 36 (L) 08/24/2015 11:55 AM   CHOLHDL 4.4 08/24/2015 11:55 AM   LDLCALC 106 (H) 08/24/2015 11:55 AM    Wt Readings from Last 3 Encounters:  05/20/19 132 lb (59.9 kg)  10/30/16 138 lb 8 oz (62.8 kg)  10/17/16 139 lb 1.6 oz (63.1 kg)     Objective:    Vital Signs:  Ht 5\' 2"  (1.575 m)   Wt 132 lb (59.9 kg)   BMI 24.14 kg/m    VITAL SIGNS:  reviewed  ASSESSMENT & PLAN:    1.  Peripheral arterial disease: moderate bilateral calf claudication with some worsening of symptoms since last visit.  I am going to repeat lower extremity arterial Doppler in 3 months from now.  I stressed the importance of controlling his risk factors especially quitting smoking again.  2.  Tobacco use: I discussed with him the importance of smoking cessation.  3. Hyperlipidemia: Continue treatment with atorvastatin.  He will need lipid and liver profile upon follow-up  4. Essential hypertension: Currently on lisinopril.  He could not check his blood pressure.   COVID-19 Education: The signs and symptoms of COVID-19 were discussed with the patient and how to seek care for testing (follow up with PCP or arrange E-visit).  The importance of social distancing was discussed today.  Time:   Today, I have spent 12 minutes with the patient with telehealth technology discussing the above problems.     Medication Adjustments/Labs and Tests Ordered: Current medicines are reviewed at length with the patient today.  Concerns regarding medicines are outlined above.   Tests Ordered: No orders of the defined types were placed in this encounter.   Medication Changes: No orders of the defined types were placed in this encounter.   Disposition:   Follow up in 3 month(s)  Signed, Lorine Bears, MD  05/20/2019 9:47 AM    [facility]

## 2020-02-15 ENCOUNTER — Other Ambulatory Visit: Payer: Self-pay

## 2021-02-18 ENCOUNTER — Telehealth: Payer: Self-pay | Admitting: Cardiovascular Disease

## 2021-02-18 NOTE — Telephone Encounter (Signed)
Patient has been contacted at least 3 times for a recall, recall has been deleted  

## 2023-02-18 ENCOUNTER — Encounter: Payer: Self-pay | Admitting: Orthopaedic Surgery

## 2023-02-18 ENCOUNTER — Ambulatory Visit (INDEPENDENT_AMBULATORY_CARE_PROVIDER_SITE_OTHER): Payer: Medicare Other | Admitting: Orthopaedic Surgery

## 2023-02-18 ENCOUNTER — Ambulatory Visit (INDEPENDENT_AMBULATORY_CARE_PROVIDER_SITE_OTHER): Payer: Medicare Other

## 2023-02-18 VITALS — BP 172/85 | HR 54 | Ht 62.0 in | Wt 136.0 lb

## 2023-02-18 DIAGNOSIS — M5441 Lumbago with sciatica, right side: Secondary | ICD-10-CM

## 2023-02-18 DIAGNOSIS — G8929 Other chronic pain: Secondary | ICD-10-CM

## 2023-02-18 MED ORDER — METHOCARBAMOL 500 MG PO TABS: ORAL_TABLET | ORAL | 1 refills | Status: DC

## 2023-02-18 MED ORDER — NAPROXEN 500 MG PO TABS: 500.0000 mg | ORAL_TABLET | Freq: Two times a day (BID) | ORAL | 5 refills | Status: DC

## 2023-02-18 NOTE — Progress Notes (Signed)
Subjective:    Patient ID: Albert Villarreal, male    DOB: 1946-04-26, 77 y.o.   MRN: [id number]  HPI He has had right thigh pain for over a  year that has spasms after sitting a while.  He has no spasm at night.  He has no trauma, no weakness, no numbness.  He has tried Tylenol, ice and heat and rubs with only slight help.  He was seen at [facility].  I have reviewed the notes and X-rays.  X-rays showed old avulsion fracture of the rifht inferior pubic rami.    I have independently reviewed and interpreted x-rays of this patient done at another site by another physician or qualified health professional.  He has no other pains.  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   Review of Systems  Constitutional:  Positive for activity change.  Musculoskeletal:  Positive for arthralgias and gait problem.  All other systems reviewed and are negative. For Review of Systems, all other systems reviewed and are negative.  The following is a summary of the past history medically, past history surgically, known current medicines, social history and family history.  This information is gathered electronically by the computer from prior information and documentation.  I review this each visit and have found including this information at this point in the chart is beneficial and informative.   Past Medical History:  Diagnosis Date   Arthritis    Chronic kidney disease    Chronic kidney disease, stage 3 ([place]) 06/15/2015   Claudication of right lower extremity (HCC)    HTN (hypertension) 06/15/2015   Hypertension    Kidney stones    PAD (peripheral artery disease) ([place]) 08/24/2015    Past Surgical History:  Procedure Laterality Date   NO PAST SURGERIES      Current Outpatient Medications on File Prior to Visit  Medication Sig Dispense Refill   aspirin EC 81 MG tablet Take 1 tablet (81 mg total) by mouth daily. 90 tablet 0   atorvastatin (LIPITOR) 20 MG tablet Take 1 tablet (20 mg total)  by mouth daily. 30 tablet 5   lisinopril (PRINIVIL,ZESTRIL) 20 MG tablet Take 20 mg by mouth daily.     cilostazol (PLETAL) 50 MG tablet Take 1 tablet (50 mg total) by mouth 2 (two) times daily. (Patient not taking: Reported on 02/18/2023) 60 tablet 6   No current facility-administered medications on file prior to visit.    Social History   Socioeconomic History   Marital status: Single    Spouse name: Not on file   Number of children: Not on file   Years of education: Not on file   Highest education level: Not on file  Occupational History   Not on file  Tobacco Use   Smoking status: Former    Types: Cigarettes    Quit date: 12/29/2012    Years since quitting: 10.1   Smokeless tobacco: Never  Substance and Sexual Activity   Alcohol use: No    Alcohol/week: 0.0 standard drinks of alcohol   Drug use: No   Sexual activity: Not on file  Other Topics Concern   Not on file  Social History Narrative   Not on file   Social Determinants of Health   Financial Resource Strain: Not on file  Food Insecurity: Not on file  Transportation Needs: Not on file  Physical Activity: Not on file  Stress: Not on file  Social Connections: Not on file  Intimate  Partner Violence: Not on file    Family History  Problem Relation Age of Onset   Hypertension Mother    Hypertension Father    Prostate cancer Neg Hx    Bladder Cancer Neg Hx    Kidney cancer Neg Hx    Kidney disease Neg Hx     BP (!) 172/85   Pulse (!) 54   Ht 5' 2"$  (1.575 m)   Wt 136 lb (61.7 kg)   BMI 24.87 kg/m   Body mass index is 24.87 kg/m.       Objective:   Physical Exam Vitals and nursing note reviewed. Exam conducted with a chaperone present.  Constitutional:      Appearance: He is well-developed.  HENT:     Head: Normocephalic and atraumatic.  Eyes:     Conjunctiva/sclera: Conjunctivae normal.     Pupils: Pupils are equal, round, and reactive to light.  Cardiovascular:     Rate and Rhythm: Normal  rate and regular rhythm.  Pulmonary:     Effort: Pulmonary effort is normal.  Abdominal:     Palpations: Abdomen is soft.  Musculoskeletal:     Cervical back: Normal range of motion and neck supple.       Legs:  Skin:    General: Skin is warm and dry.  Neurological:     Mental Status: He is alert and oriented to person, place, and time.     Cranial Nerves: No cranial nerve deficit.     Motor: No abnormal muscle tone.     Coordination: Coordination normal.     Deep Tendon Reflexes: Reflexes are normal and symmetric. Reflexes normal.  Psychiatric:        Behavior: Behavior normal.        Thought Content: Thought content normal.        Judgment: Judgment normal.   X-rays were done of the lumbar spine, reported separately.        Assessment & Plan:   Encounter Diagnosis  Name Primary?   Chronic right-sided low back pain with right-sided sciatica Yes   I do not feel the avulsion fracture shown on the pelvic xays is new.  I will begin Naprosyn and Robaxin today.   I have gone over activities for him to do.  Return in one month.  Call if any problem.  Precautions discussed.  Electronically Signed Sanjuana Kava, MD 2/21/202411:57 AM

## 2023-03-18 ENCOUNTER — Ambulatory Visit: Payer: Medicare Other | Admitting: Orthopaedic Surgery

## 2023-03-18 ENCOUNTER — Encounter: Payer: Self-pay | Admitting: Orthopaedic Surgery

## 2023-03-18 VITALS — BP 153/84 | HR 61

## 2023-03-18 DIAGNOSIS — M5441 Lumbago with sciatica, right side: Secondary | ICD-10-CM | POA: Diagnosis not present

## 2023-03-18 DIAGNOSIS — G8929 Other chronic pain: Secondary | ICD-10-CM | POA: Diagnosis not present

## 2023-03-18 MED ORDER — METHOCARBAMOL 500 MG PO TABS: ORAL_TABLET | ORAL | 1 refills | Status: DC

## 2023-03-18 NOTE — Progress Notes (Signed)
My leg pain is still there at times.  He has paresthesias from the lower back to the right thigh going down to the foot on the right at times.  He has good and bad days.  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  He has no new trauma.  The Naprosyn does help.  He is out of the Robaxin.  I will refill that.  Spine/Pelvis examination:  Inspection:  Overall, sacoiliac joint benign and hips nontender; without crepitus or defects.   Thoracic spine inspection: Alignment normal without kyphosis present   Lumbar spine inspection:  Alignment  with normal lumbar lordosis, without scoliosis apparent.   Thoracic spine palpation:  without tenderness of spinal processes   Lumbar spine palpation: without tenderness of lumbar area; without tightness of lumbar muscles    Range of Motion:   Lumbar flexion, forward flexion is normal without pain or tenderness    Lumbar extension is full without pain or tenderness   Left lateral bend is normal without pain or tenderness   Right lateral bend is normal without pain or tenderness   Straight leg raising is normal  Strength & tone: normal   Stability overall normal stability  Encounter Diagnosis  Name Primary?   Chronic right-sided low back pain with right-sided sciatica Yes   I will get a MRI as he continues to have pain.  Return in two weeks.  Call if any problem.  Precautions discussed.  Electronically Signed Sanjuana Kava, MD 3/20/20248:28 AM;

## 2023-03-18 NOTE — Patient Instructions (Signed)
Central Scheduling [phone]  While we are working on your approval please go ahead and call to schedule your appointment to be done within one week. Once I have gotten the imaging approved for the facility you choose, I can't change it. [facility] is usually booked out for several weeks with imaging appointments.   DR.KEELING'S SCHEDULE IS AS FOLLOWS: TUESDAY: ALL DAY WEDNESDAY: MORNING ONLY THURSDAY: MORNING ONLY PLEASE CALL OR SEND A MESSAGE VIA MYCHART BY WEDNESDAY MORNING SO THAT I CAN SEND A REQUEST TO HIM. HE LEAVES THURSDAY BY 11:30AM. HE DOES NOT RETURN TO THE OFFICE UNTIL TUESDAY MORNINGS AND HE DOES NOT CHECK HIS WORK MESSAGES DURING HIS TIME AWAY FROM THE OFFICE. I'M  AND IF YOU NEED SOMETHING, SEND ME A MYCHART MESSAGE OR CALL. MYCHART IS THE FASTEST WAY TO GET IN CONTACT WITH ME.   

## 2023-03-31 ENCOUNTER — Ambulatory Visit (HOSPITAL_COMMUNITY)
Admission: RE | Admit: 2023-03-31 | Discharge: 2023-03-31 | Disposition: A | Discharge status: Home / Self Care | Payer: Medicare Other | Source: Ambulatory Visit | Attending: Orthopaedic Surgery | Admitting: Orthopaedic Surgery

## 2023-03-31 DIAGNOSIS — G8929 Other chronic pain: Secondary | ICD-10-CM

## 2023-03-31 DIAGNOSIS — M5441 Lumbago with sciatica, right side: Secondary | ICD-10-CM | POA: Insufficient documentation

## 2023-04-01 ENCOUNTER — Ambulatory Visit: Payer: Medicare Other | Admitting: Orthopaedic Surgery

## 2023-04-01 ENCOUNTER — Encounter: Payer: Self-pay | Admitting: Orthopaedic Surgery

## 2023-04-01 DIAGNOSIS — M5136 Other intervertebral disc degeneration, lumbar region: Secondary | ICD-10-CM | POA: Diagnosis not present

## 2023-04-01 DIAGNOSIS — G8929 Other chronic pain: Secondary | ICD-10-CM | POA: Diagnosis not present

## 2023-04-01 DIAGNOSIS — F17210 Nicotine dependence, cigarettes, uncomplicated: Secondary | ICD-10-CM

## 2023-04-01 NOTE — Progress Notes (Signed)
My back is hurting.  He had MRI of the lumbar spine which showed: IMPRESSION: 1. Symptomatic level favored to be L5-S1, where a broad-based disc herniation into the right lateral recess results in stenosis. Query Right S1 radiculitis. Up to moderate associated right L5 foraminal stenosis also.   2. Superimposed L4-L5 disc and posterior element degeneration with mild bilateral lateral recess stenosis and moderate left foraminal stenosis.   3. Mild for age lumbar spine degeneration elsewhere. No significant lumbar spinal stenosis.  I have explained the findings to him.  I will have neurosurgery see him.  He is agreeable.  I have independently reviewed the MRI.     His back is tender more on the right side with right sided sciatica.  Muscle tone and strength normal.  Weakly positive SLR.  Gait is good.  Encounter Diagnoses  Name Primary?   Chronic right-sided low back pain with right-sided sciatica Yes   DDD (degenerative disc disease), lumbar    Nicotine dependence, cigarettes, uncomplicated    To see neurosurgeon.  Call if any problem.  Precautions discussed.  Electronically Signed Sanjuana Kava, MD 4/3/20249:26 AM

## 2023-04-01 NOTE — Patient Instructions (Addendum)
You have been referred to [facility] on [address] in [place]. They will call you to schedule the appointment. If you have not heard anything in one week call them to schedule [phone]. This referral has to be uploaded to another system because they are not on the same medical records system as we are. Please allow [place] two business days to get this uploaded for you.    Steps to Quit Smoking Smoking tobacco is the leading cause of preventable death. It can affect almost every organ in the body. Smoking puts you and people around you at risk for many serious, long-lasting (chronic) diseases. Quitting smoking can be hard, but it is one of the best things that you can do for your health. It is never too late to quit. Do not give up if you cannot quit the first time. Some people need to try many times to quit. Do your best to stick to your quit plan, and talk with your doctor if you have any questions or concerns. How do I get ready to quit? Pick a date to quit. Set a date within the next 2 weeks to give you time to prepare. Write down the reasons why you are quitting. Keep this list in places where you will see it often. Tell your family, friends, and co-workers that you are quitting. Their support is important. Talk with your doctor about the choices that may help you quit. Find out if your health insurance will pay for these treatments. Know the people, places, things, and activities that make you want to smoke (triggers). Avoid them. What first steps can I take to quit smoking? Throw away all cigarettes at home, at work, and in your car. Throw away the things that you use when you smoke, such as ashtrays and lighters. Clean your car. Empty the ashtray. Clean your home, including curtains and carpets. What can I do to help me quit smoking? Talk with your doctor about taking medicines and seeing a counselor. You are more likely to succeed when you do both. If you are pregnant or  breastfeeding: Talk with your doctor about counseling or other ways to quit smoking. Do not take medicine to help you quit smoking unless your doctor tells you to. Quit right away Quit smoking completely, instead of slowly cutting back on how much you smoke over a period of time. Stopping smoking right away may be more successful than slowly quitting. Go to counseling. In-person is best if this is an option. You are more likely to quit if you go to counseling sessions regularly. Take medicine You may take medicines to help you quit. Some medicines need a prescription, and some you can buy over-the-counter. Some medicines may contain a drug called nicotine to replace the nicotine in cigarettes. Medicines may: Help you stop having the desire to smoke (cravings). Help to stop the problems that come when you stop smoking (withdrawal symptoms). Your doctor may ask you to use: Nicotine patches, gum, or lozenges. Nicotine inhalers or sprays. Non-nicotine medicine that you take by mouth. Find resources Find resources and other ways to help you quit smoking and remain smoke-free after you quit. They include: Online chats with a Social worker. Phone quitlines. Printed Furniture conservator/restorer. Support groups or group counseling. Text messaging programs. Mobile phone apps. Use apps on your mobile phone or tablet that can help you stick to your quit plan. Examples of free services include Quit Guide from the CDC and smokefree.gov  What can I do  to make it easier to quit?  Talk to your family and friends. Ask them to support and encourage you. Call a phone quitline, such as 1-800-QUIT-NOW, reach out to support groups, or work with a Social worker. Ask people who smoke to not smoke around you. Avoid places that make you want to smoke, such as: Bars. Parties. Smoke-break areas at work. Spend time with people who do not smoke. Lower the stress in your life. Stress can make you want to smoke. Try these things to  lower stress: Getting regular exercise. Doing deep-breathing exercises. Doing yoga. Meditating. What benefits will I see if I quit smoking? Over time, you may have: A better sense of smell and taste. Less coughing and sore throat. A slower heart rate. Lower blood pressure. Clearer skin. Better breathing. Fewer sick days. Summary Quitting smoking can be hard, but it is one of the best things that you can do for your health. Do not give up if you cannot quit the first time. Some people need to try many times to quit. When you decide to quit smoking, make a plan to help you succeed. Quit smoking right away, not slowly over a period of time. When you start quitting, get help and support to keep you smoke-free. This information is not intended to replace advice given to you by your health care provider. Make sure you discuss any questions you have with your health care provider. Document Revised: 12/06/2021 Document Reviewed: 12/06/2021 Elsevier Patient Education  [place].

## 2023-06-12 ENCOUNTER — Telehealth: Payer: Self-pay | Admitting: Orthopaedic Surgery

## 2023-08-09 ENCOUNTER — Telehealth: Payer: Self-pay | Admitting: Orthopaedic Surgery

## 2023-10-10 ENCOUNTER — Telehealth: Payer: Self-pay | Admitting: Orthopaedic Surgery

## 2023-10-20 ENCOUNTER — Telehealth: Payer: Self-pay | Admitting: Orthopaedic Surgery

## 2024-07-20 ENCOUNTER — Telehealth: Payer: Self-pay | Admitting: Orthopaedic Surgery

## 2024-12-14 ENCOUNTER — Inpatient Hospital Stay
Admission: EM | Admit: 2024-12-14 | Discharge: 2024-12-18 | DRG: 682 | Disposition: A | Discharge status: Home / Self Care | Attending: Internal Medicine | Admitting: Internal Medicine

## 2024-12-14 ENCOUNTER — Emergency Department

## 2024-12-14 ENCOUNTER — Other Ambulatory Visit: Payer: Self-pay

## 2024-12-14 ENCOUNTER — Encounter: Payer: Self-pay | Admitting: Emergency Medicine

## 2024-12-14 DIAGNOSIS — R6510 Systemic inflammatory response syndrome (SIRS) of non-infectious origin without acute organ dysfunction: Secondary | ICD-10-CM | POA: Diagnosis present

## 2024-12-14 DIAGNOSIS — K5710 Diverticulosis of small intestine without perforation or abscess without bleeding: Secondary | ICD-10-CM | POA: Diagnosis not present

## 2024-12-14 DIAGNOSIS — N400 Enlarged prostate without lower urinary tract symptoms: Secondary | ICD-10-CM | POA: Diagnosis present

## 2024-12-14 DIAGNOSIS — K921 Melena: Secondary | ICD-10-CM

## 2024-12-14 DIAGNOSIS — K922 Gastrointestinal hemorrhage, unspecified: Secondary | ICD-10-CM | POA: Diagnosis not present

## 2024-12-14 DIAGNOSIS — N1831 Chronic kidney disease, stage 3a: Secondary | ICD-10-CM | POA: Diagnosis present

## 2024-12-14 DIAGNOSIS — N1330 Unspecified hydronephrosis: Secondary | ICD-10-CM

## 2024-12-14 DIAGNOSIS — N320 Bladder-neck obstruction: Secondary | ICD-10-CM | POA: Diagnosis present

## 2024-12-14 DIAGNOSIS — J948 Other specified pleural conditions: Secondary | ICD-10-CM

## 2024-12-14 DIAGNOSIS — R918 Other nonspecific abnormal finding of lung field: Secondary | ICD-10-CM | POA: Diagnosis present

## 2024-12-14 DIAGNOSIS — R338 Other retention of urine: Secondary | ICD-10-CM | POA: Diagnosis not present

## 2024-12-14 DIAGNOSIS — N136 Pyonephrosis: Secondary | ICD-10-CM | POA: Diagnosis present

## 2024-12-14 DIAGNOSIS — R339 Retention of urine, unspecified: Secondary | ICD-10-CM

## 2024-12-14 DIAGNOSIS — R0902 Hypoxemia: Secondary | ICD-10-CM | POA: Diagnosis present

## 2024-12-14 DIAGNOSIS — D649 Anemia, unspecified: Secondary | ICD-10-CM

## 2024-12-14 DIAGNOSIS — R710 Precipitous drop in hematocrit: Secondary | ICD-10-CM | POA: Diagnosis not present

## 2024-12-14 DIAGNOSIS — R972 Elevated prostate specific antigen [PSA]: Secondary | ICD-10-CM | POA: Diagnosis present

## 2024-12-14 DIAGNOSIS — Z87442 Personal history of urinary calculi: Secondary | ICD-10-CM | POA: Diagnosis not present

## 2024-12-14 DIAGNOSIS — E8721 Acute metabolic acidosis: Secondary | ICD-10-CM | POA: Diagnosis present

## 2024-12-14 DIAGNOSIS — Z87891 Personal history of nicotine dependence: Secondary | ICD-10-CM | POA: Diagnosis not present

## 2024-12-14 DIAGNOSIS — Z862 Personal history of diseases of the blood and blood-forming organs and certain disorders involving the immune mechanism: Secondary | ICD-10-CM | POA: Diagnosis not present

## 2024-12-14 DIAGNOSIS — Z8249 Family history of ischemic heart disease and other diseases of the circulatory system: Secondary | ICD-10-CM

## 2024-12-14 DIAGNOSIS — E8729 Other acidosis: Secondary | ICD-10-CM

## 2024-12-14 DIAGNOSIS — Z79899 Other long term (current) drug therapy: Secondary | ICD-10-CM

## 2024-12-14 DIAGNOSIS — K5730 Diverticulosis of large intestine without perforation or abscess without bleeding: Secondary | ICD-10-CM

## 2024-12-14 DIAGNOSIS — Z791 Long term (current) use of non-steroidal anti-inflammatories (NSAID): Secondary | ICD-10-CM

## 2024-12-14 DIAGNOSIS — K2970 Gastritis, unspecified, without bleeding: Secondary | ICD-10-CM | POA: Diagnosis present

## 2024-12-14 DIAGNOSIS — D62 Acute posthemorrhagic anemia: Secondary | ICD-10-CM | POA: Diagnosis present

## 2024-12-14 DIAGNOSIS — E875 Hyperkalemia: Secondary | ICD-10-CM | POA: Diagnosis present

## 2024-12-14 DIAGNOSIS — R000 Tachycardia, unspecified: Secondary | ICD-10-CM | POA: Diagnosis present

## 2024-12-14 DIAGNOSIS — M1990 Unspecified osteoarthritis, unspecified site: Secondary | ICD-10-CM | POA: Diagnosis present

## 2024-12-14 DIAGNOSIS — Z7982 Long term (current) use of aspirin: Secondary | ICD-10-CM | POA: Diagnosis not present

## 2024-12-14 DIAGNOSIS — I129 Hypertensive chronic kidney disease with stage 1 through stage 4 chronic kidney disease, or unspecified chronic kidney disease: Secondary | ICD-10-CM | POA: Diagnosis present

## 2024-12-14 DIAGNOSIS — I2510 Atherosclerotic heart disease of native coronary artery without angina pectoris: Secondary | ICD-10-CM | POA: Diagnosis present

## 2024-12-14 DIAGNOSIS — I959 Hypotension, unspecified: Secondary | ICD-10-CM | POA: Diagnosis present

## 2024-12-14 DIAGNOSIS — K2980 Duodenitis without bleeding: Secondary | ICD-10-CM | POA: Diagnosis present

## 2024-12-14 DIAGNOSIS — N189 Chronic kidney disease, unspecified: Secondary | ICD-10-CM | POA: Diagnosis not present

## 2024-12-14 DIAGNOSIS — I7090 Unspecified atherosclerosis: Secondary | ICD-10-CM

## 2024-12-14 DIAGNOSIS — I739 Peripheral vascular disease, unspecified: Secondary | ICD-10-CM | POA: Diagnosis present

## 2024-12-14 DIAGNOSIS — K5751 Diverticulosis of both small and large intestine without perforation or abscess with bleeding: Secondary | ICD-10-CM | POA: Diagnosis present

## 2024-12-14 DIAGNOSIS — I10 Essential (primary) hypertension: Secondary | ICD-10-CM | POA: Diagnosis present

## 2024-12-14 DIAGNOSIS — N179 Acute kidney failure, unspecified: Secondary | ICD-10-CM | POA: Diagnosis present

## 2024-12-14 DIAGNOSIS — N10 Acute tubulo-interstitial nephritis: Secondary | ICD-10-CM

## 2024-12-14 LAB — COMPREHENSIVE METABOLIC PANEL WITH GFR
ALT: 15 U/L (ref 0–44)
AST: 17 U/L (ref 15–41)
Albumin: 3.6 g/dL (ref 3.5–5.0)
Alkaline Phosphatase: 45 U/L (ref 38–126)
Anion gap: 26 — ABNORMAL HIGH (ref 5–15)
BUN: 135 mg/dL — ABNORMAL HIGH (ref 8–23)
CO2: 14 mmol/L — ABNORMAL LOW (ref 22–32)
Calcium: 8.4 mg/dL — ABNORMAL LOW (ref 8.9–10.3)
Chloride: 96 mmol/L — ABNORMAL LOW (ref 98–111)
Creatinine, Ser: 18.5 mg/dL — ABNORMAL HIGH (ref 0.61–1.24)
GFR, Estimated: 2 mL/min — ABNORMAL LOW (ref >60)
Glucose, Bld: 144 mg/dL — ABNORMAL HIGH (ref 70–99)
Potassium: 6.5 mmol/L (ref 3.5–5.1)
Sodium: 136 mmol/L (ref 135–145)
Total Bilirubin: 0.2 mg/dL (ref 0.0–1.2)
Total Protein: 6.5 g/dL (ref 6.5–8.1)

## 2024-12-14 LAB — CBC
HCT: 35.9 % — ABNORMAL LOW (ref 39.0–52.0)
Hemoglobin: 12.9 g/dL — ABNORMAL LOW (ref 13.0–17.0)
MCH: 32.6 pg (ref 26.0–34.0)
MCHC: 35.9 g/dL (ref 30.0–36.0)
MCV: 90.7 fL (ref 80.0–100.0)
Platelets: 267 K/uL (ref 150–400)
RBC: 3.96 MIL/uL — ABNORMAL LOW (ref 4.22–5.81)
RDW: 14.7 % (ref 11.5–15.5)
WBC: 12.7 K/uL — ABNORMAL HIGH (ref 4.0–10.5)
nRBC: 0 % (ref 0.0–0.2)

## 2024-12-14 LAB — CBG MONITORING, ED: Glucose-Capillary: 116 mg/dL — ABNORMAL HIGH (ref 70–99)

## 2024-12-14 MED ORDER — PATIROMER SORBITEX CALCIUM 8.4 G PO PACK
25.2000 g | PACK | Freq: Every day | ORAL | Status: DC
  Administered 2024-12-14 – 2024-12-18 (×4): 25.2 g via ORAL
  Filled 2024-12-14 (×5): qty 3

## 2024-12-14 MED ORDER — DEXTROSE 50 % IV SOLN
1.0000 | Freq: Once | INTRAVENOUS | Status: AC
  Administered 2024-12-14: 21:00:00 50 mL via INTRAVENOUS
  Filled 2024-12-14: qty 50

## 2024-12-14 MED ORDER — STERILE WATER FOR INJECTION IV SOLN
INTRAVENOUS | Status: AC
  Filled 2024-12-14 (×2): qty 1000

## 2024-12-14 MED ORDER — SODIUM CHLORIDE 0.9 % IV BOLUS
500.0000 mL | Freq: Once | INTRAVENOUS | Status: AC
  Administered 2024-12-15: 500 mL via INTRAVENOUS

## 2024-12-14 MED ORDER — SODIUM ZIRCONIUM CYCLOSILICATE 10 G PO PACK
10.0000 g | PACK | Freq: Once | ORAL | Status: AC
  Administered 2024-12-14: 21:00:00 10 g via ORAL
  Filled 2024-12-14: qty 1

## 2024-12-14 MED ORDER — CALCIUM GLUCONATE-NACL 1-0.675 GM/50ML-% IV SOLN
1.0000 g | Freq: Once | INTRAVENOUS | Status: AC
  Administered 2024-12-14: 21:00:00 1000 mg via INTRAVENOUS
  Filled 2024-12-14: qty 50

## 2024-12-14 MED ORDER — SODIUM CHLORIDE 0.9 % IV BOLUS
1000.0000 mL | Freq: Once | INTRAVENOUS | Status: AC
  Administered 2024-12-14: 20:00:00 1000 mL via INTRAVENOUS

## 2024-12-14 MED ORDER — PANTOPRAZOLE SODIUM 40 MG IV SOLR
80.0000 mg | Freq: Once | INTRAVENOUS | Status: AC
  Administered 2024-12-14: 21:00:00 80 mg via INTRAVENOUS
  Filled 2024-12-14: qty 20

## 2024-12-14 MED ORDER — INSULIN ASPART 100 UNIT/ML IV SOLN
10.0000 [IU] | Freq: Once | INTRAVENOUS | Status: AC
  Administered 2024-12-14: 21:00:00 10 [IU] via INTRAVENOUS
  Filled 2024-12-14: qty 0.1
  Filled 2024-12-14: qty 10

## 2024-12-14 MED ORDER — SODIUM CHLORIDE 0.9 % IV BOLUS
1000.0000 mL | Freq: Once | INTRAVENOUS | Status: AC
  Administered 2024-12-14: 21:00:00 1000 mL via INTRAVENOUS

## 2024-12-14 NOTE — ED Triage Notes (Addendum)
 Pt arrives POV reporting weakness, lower abd/back pain w/ dark blood tinged stools x 1.5 week. Denies blood thinner.  Pt was found to be hypotensive while waiting to be triaged. Pt roomed upon findings.

## 2024-12-14 NOTE — ED Provider Notes (Signed)
 [facility] Provider Note    Event Date/Time   First MD Initiated Contact with Patient 12/14/24 1915     (approximate)   History   Rectal Bleeding (Dark stools), Abdominal Pain, and Weakness   HPI  Albert Villarreal is a 78 y.o. male with history of PAD, hypertension, CKD, presenting with blood in his stool.  Had reported generalized weakness, lower abdominal pain with blood in his stool for last 1.5 weeks.  Not on any blood thinning medications.  Does note bilateral abdominal pain.  No recent trauma or falls.  Has any chest pain or shortness of breath.  States that he has had a GI bleed about 10 years ago.   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  Had an EGD done in 2014 that showed gastritis.  He was started on IV steroids to improve his platelet counts.      Physical Exam   Triage Vital Signs: ED Triage Vitals  Encounter Vitals Group     BP 12/14/24 1909 (!) 87/58     Girls Systolic BP Percentile --      Girls Diastolic BP Percentile --      Boys Systolic BP Percentile --      Boys Diastolic BP Percentile --      Pulse Rate 12/14/24 1909 95     Resp 12/14/24 1909 16     Temp 12/14/24 1909 97.6 F (36.4 C)     Temp Source 12/14/24 1909 Oral     SpO2 12/14/24 1909 97 %     Weight 12/14/24 1901 130 lb (59 kg)     Height 12/14/24 1901 5' 2 (1.575 m)     Head Circumference --      Peak Flow --      Pain Score 12/14/24 1910 5     Pain Loc --      Pain Education --      Exclude from Growth Chart --     Most recent vital signs: Vitals:   12/14/24 2015 12/14/24 2030  BP: (!) 120/107   Pulse: (!) 47   Resp: 15 15  Temp:    SpO2: 91%      General: Awake, no distress.  CV:  Good peripheral perfusion.  Resp:  Normal effort.  Clear, no tachypnea or respiratory distress Abd:  No distention.  Soft, tender to bilateral lower  quadrants without guarding Other:  Moves all 4 extremity without focal weakness, mildly dry oral mucosa   ED Results / Procedures / Treatments   Labs (all labs ordered are listed, but only abnormal results are displayed) Labs Reviewed  COMPREHENSIVE METABOLIC PANEL WITH GFR - Abnormal; Notable for the following components:      Result Value   Potassium 6.5 (*)    Chloride 96 (*)    CO2 14 (*)    Glucose, Bld 144 (*)    BUN 135 (*)    Creatinine, Ser 18.50 (*)    Calcium  8.4 (*)    GFR, Estimated 2 (*)    Anion gap 26 (*)    All other components within normal limits  CBC - Abnormal; Notable for the following components:   WBC 12.7 (*)    RBC 3.96 (*)    Hemoglobin 12.9 (*)    HCT 35.9 (*)    All other components within normal limits  CBG  MONITORING, ED - Abnormal; Notable for the following components:   Glucose-Capillary 116 (*)    All other components within normal limits  POC OCCULT BLOOD, ED  TYPE AND SCREEN     EKG  EKG shows, sinus rhythm, rate 95, normal QS, normal QTc, no obvious ischemic ST elevation, T wave flattening to aVL, V6, 3, aVF, T wave change to lateral leads are new compared to prior, also T wave in V3 is a bit more peak than previous.   RADIOLOGY On my independent interpretation, chest x-ray without obvious consolidation   PROCEDURES:  Critical Care performed: Yes, see critical care procedure note(s)  .Critical Care  Performed by: Waymond Lorelle Cummins, MD Authorized by: Waymond Lorelle Cummins, MD   Critical care provider statement:    Critical care time (minutes):  50   Critical care was necessary to treat or prevent imminent or life-threatening deterioration of the following conditions:  Metabolic crisis   Critical care was time spent personally by me on the following activities:  Development of treatment plan with patient or surrogate, discussions with consultants, evaluation of patient's response to treatment, examination of patient, ordering and review of  laboratory studies, ordering and review of radiographic studies, ordering and performing treatments and interventions, pulse oximetry, re-evaluation of patient's condition and review of old charts    MEDICATIONS ORDERED IN ED: Medications  patiromer  (VELTASSA ) packet 25.2 g (has no administration in time range)  sodium chloride  0.9 % bolus 1,000 mL (0 mLs Intravenous Stopped 12/14/24 2111)  pantoprazole  (PROTONIX ) injection 80 mg (80 mg Intravenous Given 12/14/24 2107)  insulin  aspart (novoLOG ) injection 10 Units (10 Units Intravenous Given 12/14/24 2106)    And  dextrose  50 % solution 50 mL (50 mLs Intravenous Given 12/14/24 2104)  sodium zirconium cyclosilicate  (LOKELMA ) packet 10 g (10 g Oral Given 12/14/24 2111)  calcium  gluconate 1 g/ 50 mL sodium chloride  IVPB (1,000 mg Intravenous New Bag/Given 12/14/24 2114)  sodium chloride  0.9 % bolus 1,000 mL (1,000 mLs Intravenous New Bag/Given 12/14/24 2113)     IMPRESSION / MDM / ASSESSMENT AND PLAN / ED COURSE  I reviewed the triage vital signs and the nursing notes.                              Differential diagnosis includes, but is not limited to, GI bleed, peptic ulcer disease, gastritis, ITP, thrombocytopenia, anemia, electrolyte derangements, dehydration.  For the hypoxia noted in triage,, he was titrated off the oxygen, satting 97% on room air.  He has no shortness of breath or chest pain.  Labs, EKG, chest x-ray, CT angio GI bleed protocol.  Will give him some IV fluids here.  Patient's presentation is most consistent with acute presentation with potential threat to life or bodily function.  Independent interpretation of labs and imaging below.  Labs are notable for acute renal failure with hyper kalemia, will start him on insulin  dextrose , Lokelma , calcium  gluconate, he received IV fluids earlier.  Will reach out to nephrology.  Replaced his CT angio with a CT Noncon given his abdominal pain.  His CT does show a markedly distended  bladder with mild hydronephrosis, this is likely the cause of his renal failure.  Will place a Foley.  He will need to be admitted for further management.  Consult the hospitalist will admit the patient.  He is admitted.    Clinical Course as of 12/14/24 2143  Wed Dec 14, 2024  1953 Blood pressures in the room systolic 100s. [TT]  2007 DG Chest 1 View 1. No acute process.  [TT]  2023 Independent review of labs, mild leukocytosis, he is notable for acute hyperkalemia, creatinine is elevated, LFTs are normal.  He is also acidotic with anion gap, BUN is also elevated, could be related to his melena versus renal failure.  His last creatinine was from 9 years ago that showed a creatinine level 1.19.  Patient states that he thinks he has had decreased p.o. intake for last several days, has not peed in 2 to 3 days. [TT]  2029 Consulted nephrology, recommended Lokelma  10 mg, Veltassa  25.2 mg.  States to give him some insulin  dextrose , hydration, recommended CT to look for obstruction and to bring him into the hospital. [TT]  2043 CT on my independent review shows an enlarged bladder, will order Foley placement. [TT]  2101 CT ABDOMEN PELVIS WO CONTRAST IMPRESSION: 1. Indeterminate 2.2 x 1.1 cm subpleural pulmonary mass with speckled internal calcifications in the right lung base; dedicated non-emergent chest CT in 1 month recommended to assess for interval change and establish a baseline if no prior imaging is available. 2. marked distention of the bladder with an estimated volume of 1760 ml with mild bilateral hydronephrosis, most likely secondary to bladder outlet obstruction. 3. Moderate bilateral perinephric inflammatory stranding, possibly related to superimposed ascending urinary tract infection. Correlation with urinalysis and urine culture may be helpful for further management. 4. Extensive coronary artery calcifications.  Marked distention of the bladder measuring 11.8 x 9.4 x 15.8 cm with  mild bilateral hydronephrosis, most likely secondary to bladder outlet obstruction. Moderate bilateral perinephric inflammatory stranding, greater on the right, which may reflect superimposed ascending urinary tract infection; correlation with urinalysis and urine culture may be helpful for further management. No intrarenal or ureteral calculi identified. Moderate descending and sigmoid colonic diverticulosis without imaging evidence of diverticulitis. Extensive aortoiliac atherosclerotic calcification. Extensive coronary artery calcifications. Small left inferolateral congenital bladder diverticulum. Sinus fat-containing umbilical hernia.   [TT]    Clinical Course User Index [TT] Waymond Lorelle Cummins, MD     FINAL CLINICAL IMPRESSION(S) / ED DIAGNOSES   Final diagnoses:  Melena  AKI (acute kidney injury)  Urinary retention  Hydronephrosis, unspecified hydronephrosis type  Hyperkalemia  Pulmonary mass     Rx / DC Orders   ED Discharge Orders     None        Note:  This document was prepared using Dragon voice recognition software and may include unintentional dictation errors.    Waymond Lorelle Cummins, MD 12/14/24 [phone]

## 2024-12-14 NOTE — ED Triage Notes (Signed)
 First nurse note: pt to ED [facility] from home for weakness, small drops blood in stool, 88% on RA, placed on 2l [place]. Bp 127/97. HR 80s

## 2024-12-15 ENCOUNTER — Encounter: Payer: Self-pay | Admitting: Internal Medicine

## 2024-12-15 ENCOUNTER — Inpatient Hospital Stay: Admitting: Anesthesiology

## 2024-12-15 ENCOUNTER — Encounter: Admission: EM | Disposition: A | Payer: Self-pay | Source: Home / Self Care | Attending: Internal Medicine

## 2024-12-15 ENCOUNTER — Inpatient Hospital Stay

## 2024-12-15 DIAGNOSIS — E875 Hyperkalemia: Secondary | ICD-10-CM

## 2024-12-15 DIAGNOSIS — R710 Precipitous drop in hematocrit: Secondary | ICD-10-CM

## 2024-12-15 DIAGNOSIS — K2980 Duodenitis without bleeding: Secondary | ICD-10-CM | POA: Diagnosis not present

## 2024-12-15 DIAGNOSIS — K2970 Gastritis, unspecified, without bleeding: Secondary | ICD-10-CM | POA: Diagnosis not present

## 2024-12-15 DIAGNOSIS — N10 Acute tubulo-interstitial nephritis: Secondary | ICD-10-CM

## 2024-12-15 DIAGNOSIS — K921 Melena: Secondary | ICD-10-CM

## 2024-12-15 DIAGNOSIS — Z862 Personal history of diseases of the blood and blood-forming organs and certain disorders involving the immune mechanism: Secondary | ICD-10-CM

## 2024-12-15 DIAGNOSIS — D62 Acute posthemorrhagic anemia: Secondary | ICD-10-CM

## 2024-12-15 DIAGNOSIS — R6510 Systemic inflammatory response syndrome (SIRS) of non-infectious origin without acute organ dysfunction: Secondary | ICD-10-CM

## 2024-12-15 DIAGNOSIS — K5710 Diverticulosis of small intestine without perforation or abscess without bleeding: Secondary | ICD-10-CM

## 2024-12-15 DIAGNOSIS — K5730 Diverticulosis of large intestine without perforation or abscess without bleeding: Secondary | ICD-10-CM | POA: Diagnosis not present

## 2024-12-15 DIAGNOSIS — K922 Gastrointestinal hemorrhage, unspecified: Secondary | ICD-10-CM

## 2024-12-15 DIAGNOSIS — D649 Anemia, unspecified: Secondary | ICD-10-CM

## 2024-12-15 HISTORY — PX: ESOPHAGOGASTRODUODENOSCOPY: SHX5428

## 2024-12-15 LAB — CBC
HCT: 24.3 % — ABNORMAL LOW (ref 39.0–52.0)
HCT: 25.4 % — ABNORMAL LOW (ref 39.0–52.0)
HCT: 29 % — ABNORMAL LOW (ref 39.0–52.0)
Hemoglobin: 8.5 g/dL — ABNORMAL LOW (ref 13.0–17.0)
Hemoglobin: 8.8 g/dL — ABNORMAL LOW (ref 13.0–17.0)
Hemoglobin: 10.4 g/dL — ABNORMAL LOW (ref 13.0–17.0)
MCH: 32.2 pg (ref 26.0–34.0)
MCH: 31.9 pg (ref 26.0–34.0)
MCH: 32.2 pg (ref 26.0–34.0)
MCHC: 35 g/dL (ref 30.0–36.0)
MCHC: 34.6 g/dL (ref 30.0–36.0)
MCHC: 35.9 g/dL (ref 30.0–36.0)
MCV: 92 fL (ref 80.0–100.0)
MCV: 92 fL (ref 80.0–100.0)
MCV: 89.8 fL (ref 80.0–100.0)
Platelets: 189 K/uL (ref 150–400)
Platelets: 211 K/uL (ref 150–400)
Platelets: 201 K/uL (ref 150–400)
RBC: 2.64 MIL/uL — ABNORMAL LOW (ref 4.22–5.81)
RBC: 2.76 MIL/uL — ABNORMAL LOW (ref 4.22–5.81)
RBC: 3.23 MIL/uL — ABNORMAL LOW (ref 4.22–5.81)
RDW: 14.9 % (ref 11.5–15.5)
RDW: 14.9 % (ref 11.5–15.5)
RDW: 14.2 % (ref 11.5–15.5)
WBC: 12.1 K/uL — ABNORMAL HIGH (ref 4.0–10.5)
WBC: 12.5 K/uL — ABNORMAL HIGH (ref 4.0–10.5)
WBC: 10.1 K/uL (ref 4.0–10.5)
nRBC: 0 % (ref 0.0–0.2)
nRBC: 0 % (ref 0.0–0.2)
nRBC: 0 % (ref 0.0–0.2)

## 2024-12-15 LAB — URINALYSIS, COMPLETE (UACMP) WITH MICROSCOPIC
Bilirubin Urine: NEGATIVE
Glucose, UA: NEGATIVE mg/dL
Ketones, ur: NEGATIVE mg/dL
Leukocytes,Ua: NEGATIVE
Nitrite: NEGATIVE
Protein, ur: NEGATIVE mg/dL
Specific Gravity, Urine: 1.01 (ref 1.005–1.030)
pH: 6 (ref 5.0–8.0)

## 2024-12-15 LAB — LACTIC ACID, PLASMA: Lactic Acid, Venous: 1.6 mmol/L (ref 0.5–1.9)

## 2024-12-15 LAB — BASIC METABOLIC PANEL WITH GFR
Anion gap: 12 (ref 5–15)
Anion gap: 17 — ABNORMAL HIGH (ref 5–15)
Anion gap: 19 — ABNORMAL HIGH (ref 5–15)
BUN: 109 mg/dL — ABNORMAL HIGH (ref 8–23)
BUN: 126 mg/dL — ABNORMAL HIGH (ref 8–23)
BUN: 77 mg/dL — ABNORMAL HIGH (ref 8–23)
CO2: 18 mmol/L — ABNORMAL LOW (ref 22–32)
CO2: 19 mmol/L — ABNORMAL LOW (ref 22–32)
CO2: 24 mmol/L (ref 22–32)
Calcium: 8.1 mg/dL — ABNORMAL LOW (ref 8.9–10.3)
Calcium: 8.2 mg/dL — ABNORMAL LOW (ref 8.9–10.3)
Calcium: 8.3 mg/dL — ABNORMAL LOW (ref 8.9–10.3)
Chloride: 103 mmol/L (ref 98–111)
Chloride: 103 mmol/L (ref 98–111)
Chloride: 108 mmol/L (ref 98–111)
Creatinine, Ser: 12.3 mg/dL — ABNORMAL HIGH (ref 0.61–1.24)
Creatinine, Ser: 16.2 mg/dL — ABNORMAL HIGH (ref 0.61–1.24)
Creatinine, Ser: 6.46 mg/dL — ABNORMAL HIGH (ref 0.61–1.24)
GFR, Estimated: 3 mL/min — ABNORMAL LOW (ref >60)
GFR, Estimated: 4 mL/min — ABNORMAL LOW (ref >60)
GFR, Estimated: 8 mL/min — ABNORMAL LOW (ref >60)
Glucose, Bld: 165 mg/dL — ABNORMAL HIGH (ref 70–99)
Glucose, Bld: 63 mg/dL — ABNORMAL LOW (ref 70–99)
Glucose, Bld: 89 mg/dL (ref 70–99)
Potassium: 5.1 mmol/L (ref 3.5–5.1)
Potassium: 5.1 mmol/L (ref 3.5–5.1)
Potassium: 5.2 mmol/L — ABNORMAL HIGH (ref 3.5–5.1)
Sodium: 139 mmol/L (ref 135–145)
Sodium: 139 mmol/L (ref 135–145)
Sodium: 143 mmol/L (ref 135–145)

## 2024-12-15 LAB — CBG MONITORING, ED
Glucose-Capillary: 61 mg/dL — ABNORMAL LOW (ref 70–99)
Glucose-Capillary: 86 mg/dL (ref 70–99)

## 2024-12-15 LAB — PROCALCITONIN: Procalcitonin: 0.11 ng/mL

## 2024-12-15 LAB — ABO/RH: ABO/RH(D): O POS

## 2024-12-15 SURGERY — EGD (ESOPHAGOGASTRODUODENOSCOPY)
Anesthesia: General

## 2024-12-15 MED ORDER — SODIUM CHLORIDE 0.9 % IV SOLN
1.0000 g | INTRAVENOUS | Status: DC
  Administered 2024-12-15 – 2024-12-17 (×4): 1 g via INTRAVENOUS
  Filled 2024-12-15 (×4): qty 10

## 2024-12-15 MED ORDER — ENOXAPARIN SODIUM 30 MG/0.3ML IJ SOSY: 30.0000 mg | PREFILLED_SYRINGE | INTRAMUSCULAR | Status: DC

## 2024-12-15 MED ORDER — ACETAMINOPHEN 325 MG PO TABS: 650.0000 mg | ORAL_TABLET | Freq: Four times a day (QID) | ORAL | Status: DC | PRN

## 2024-12-15 MED ORDER — CHLORHEXIDINE GLUCONATE CLOTH 2 % EX PADS
6.0000 | MEDICATED_PAD | Freq: Every day | CUTANEOUS | Status: DC
  Administered 2024-12-15 – 2024-12-18 (×3): 6 via TOPICAL

## 2024-12-15 MED ORDER — PANTOPRAZOLE SODIUM 40 MG IV SOLR
40.0000 mg | Freq: Two times a day (BID) | INTRAVENOUS | Status: DC
  Administered 2024-12-15 – 2024-12-17 (×6): 40 mg via INTRAVENOUS
  Filled 2024-12-15 (×6): qty 10

## 2024-12-15 MED ORDER — TECHNETIUM TC 99M-LABELED RED BLOOD CELLS IV KIT
21.3100 | PACK | Freq: Once | INTRAVENOUS | Status: AC | PRN
  Administered 2024-12-15: 12:00:00 21.31 via INTRAVENOUS

## 2024-12-15 MED ORDER — SODIUM CHLORIDE 0.9% IV SOLUTION
Freq: Once | INTRAVENOUS | Status: AC
  Filled 2024-12-15: qty 250

## 2024-12-15 MED ORDER — ACETAMINOPHEN 650 MG RE SUPP: 650.0000 mg | Freq: Four times a day (QID) | RECTAL | Status: DC | PRN

## 2024-12-15 MED ORDER — SODIUM CHLORIDE 0.9 % IV SOLN: INTRAVENOUS | Status: DC

## 2024-12-15 MED ORDER — HEPARIN SODIUM (PORCINE) 5000 UNIT/ML IJ SOLN
5000.0000 [IU] | Freq: Three times a day (TID) | INTRAMUSCULAR | Status: DC
  Filled 2024-12-15: qty 1

## 2024-12-15 MED ORDER — NA SULFATE-K SULFATE-MG SULF 17.5-3.13-1.6 GM/177ML PO SOLN: 1.0000 | Freq: Once | ORAL | Status: DC

## 2024-12-15 MED ORDER — PROPOFOL 500 MG/50ML IV EMUL
INTRAVENOUS | Status: DC | PRN
  Administered 2024-12-15: 14:00:00 50 ug/kg/min via INTRAVENOUS

## 2024-12-15 MED ORDER — SODIUM CHLORIDE 0.9 % IV SOLN: INTRAVENOUS | Status: AC

## 2024-12-15 MED ORDER — PEG 3350-KCL-NA BICARB-NACL 420 G PO SOLR
4000.0000 mL | Freq: Once | ORAL | Status: AC
  Administered 2024-12-15: 19:00:00 4000 mL via ORAL
  Filled 2024-12-15: qty 4000

## 2024-12-15 MED ORDER — ONDANSETRON HCL 4 MG/2ML IJ SOLN: 4.0000 mg | Freq: Four times a day (QID) | INTRAMUSCULAR | Status: DC | PRN

## 2024-12-15 MED ORDER — PROPOFOL 10 MG/ML IV BOLUS
INTRAVENOUS | Status: DC | PRN
  Administered 2024-12-15 (×3): 20 mg via INTRAVENOUS

## 2024-12-15 MED ORDER — LIDOCAINE HCL (CARDIAC) PF 100 MG/5ML IV SOSY
PREFILLED_SYRINGE | INTRAVENOUS | Status: DC | PRN
  Administered 2024-12-15: 14:00:00 60 mg via INTRAVENOUS

## 2024-12-15 MED ORDER — OXYCODONE HCL 5 MG PO TABS: 5.0000 mg | ORAL_TABLET | ORAL | Status: DC | PRN

## 2024-12-15 MED ORDER — ONDANSETRON HCL 4 MG PO TABS: 4.0000 mg | ORAL_TABLET | Freq: Four times a day (QID) | ORAL | Status: DC | PRN

## 2024-12-15 NOTE — Assessment & Plan Note (Signed)
 Subpleural mass with recommendation for follow-up CT in 1 month

## 2024-12-15 NOTE — Consult Note (Signed)
 [facility] CONSULT NOTE    Date: 12/15/2024                  Patient Name:  Albert Villarreal  MRN: [id number]  DOB: 1946/11/21  Age / Sex: 78 y.o., male         PCP: [name] DASEN, PA-C                 Service Requesting Consult: Hospitalist                 Reason for Consult: Acute kidney injury in the setting of bladder outlet obstruction            History of Present Illness: Patient is a 78 y.o. male with a PMHx of hypertension, chronic kidney disease stage II, nephrolithiasis, peripheral arterial disease, osteoarthritis, history of tobacco abuse, who was admitted to [facility] on 12/14/2024 for evaluation of abdominal pain, weakness, melena, and difficulty urinating x 2 weeks.  We were asked to see the patient for evaluation management of acute kidney injury.  Upon initial presentation BUN was 135, creatinine 18.5, potassium 6.5, and serum bicarbonate 14.  Imaging revealed severe bladder outlet obstruction.  Subsequently Foley catheter was placed.  Patient subsequently did produce significant urine.  Labs were rechecked earlier this a.m. and metabolic parameters improved as BUN down to 109 with creatinine down to 12.3 and serum bicarbonate up to 19.  Serum potassium was down to 5.2.  Also had GI bleed and gastroenterology consulted.   Medications: Outpatient medications: Medications Prior to Admission  Medication Sig Dispense Refill Last Dose/Taking   lisinopril (PRINIVIL,ZESTRIL) 20 MG tablet Take 20 mg by mouth daily.   Unknown   naproxen  (NAPROSYN ) 500 MG tablet TAKE ONE TABLET BY MOUTH TWICE DAILY WITH MEALS 60 tablet 5 Unknown   rosuvastatin (CRESTOR) 10 MG tablet Take 10 mg by mouth daily.   Unknown   tamsulosin (FLOMAX) 0.4 MG CAPS capsule Take 0.4 mg by mouth daily.   Unknown   aspirin  EC 81 MG tablet Take 1 tablet (81 mg total) by mouth daily. (Patient not taking: Reported on 12/14/2024) 90 tablet 0 Not Taking   atorvastatin  (LIPITOR) 20 MG tablet  Take 1 tablet (20 mg total) by mouth daily. (Patient not taking: Reported on 12/14/2024) 30 tablet 5 Not Taking   cilostazol  (PLETAL ) 50 MG tablet Take 1 tablet (50 mg total) by mouth 2 (two) times daily. (Patient not taking: Reported on 02/18/2023) 60 tablet 6 Not Taking   methocarbamol  (ROBAXIN ) 500 MG tablet TAKE ONE TABLET BY MOUTH EVERY 8 HOURS AS NEEDED FOR MUSCLE SPASMS (Patient not taking: Reported on 12/14/2024) 60 tablet 1 Not Taking    Current medications: Current Facility-Administered Medications  Medication Dose Route Frequency Provider Last Rate Last Admin   0.9 %  sodium chloride  infusion   Intravenous Continuous Jinny Carmine, MD       acetaminophen  (TYLENOL ) tablet 650 mg  650 mg Oral Q6H PRN Duncan, Hazel V, MD       Or   acetaminophen  (TYLENOL ) suppository 650 mg  650 mg Rectal Q6H PRN Cleatus Delayne GAILS, MD       cefTRIAXone  (ROCEPHIN ) 1 g in sodium chloride  0.9 % 100 mL IVPB  1 g Intravenous Q24H Duncan, Hazel V, MD   Stopped at 12/15/24 0128   ondansetron  (ZOFRAN ) tablet 4 mg  4 mg Oral Q6H PRN Cleatus Delayne GAILS, MD       Or   ondansetron  (ZOFRAN )  injection 4 mg  4 mg Intravenous Q6H PRN Duncan, Hazel V, MD       oxyCODONE  (Oxy IR/ROXICODONE ) immediate release tablet 5 mg  5 mg Oral Q4H PRN Duncan, Hazel V, MD       pantoprazole  (PROTONIX ) injection 40 mg  40 mg Intravenous Q12H Duncan, Hazel V, MD   40 mg at 12/15/24 9081   patiromer  (VELTASSA ) packet 25.2 g  25.2 g Oral Daily Tan, Ting Xu, MD   25.2 g at 12/14/24 2259   polyethylene glycol-electrolytes (NuLYTELY) solution 4,000 mL  4,000 mL Oral Once Wohl, Darren, MD          Allergies: Allergies[1]    Past Medical History: Past Medical History:  Diagnosis Date   Arthritis    Chronic kidney disease    Chronic kidney disease, stage 3 (HCC) 06/15/2015   Claudication of right lower extremity    HTN (hypertension) 06/15/2015   Hypertension    Kidney stones    PAD (peripheral artery disease) 08/24/2015     Past  Surgical History: Past Surgical History:  Procedure Laterality Date   NO PAST SURGERIES       Family History: Family History  Problem Relation Age of Onset   Hypertension Mother    Hypertension Father    Prostate cancer Neg Hx    Bladder Cancer Neg Hx    Kidney cancer Neg Hx    Kidney disease Neg Hx      Social History: Social History   Socioeconomic History   Marital status: Single    Spouse name: Not on file   Number of children: Not on file   Years of education: Not on file   Highest education level: Not on file  Occupational History   Not on file  Tobacco Use   Smoking status: Former    Current packs/day: 0.00    Types: Cigarettes    Quit date: 12/29/2012    Years since quitting: 11.9   Smokeless tobacco: Never  Vaping Use   Vaping status: Never Used  Substance and Sexual Activity   Alcohol use: No    Alcohol/week: 0.0 standard drinks of alcohol   Drug use: No   Sexual activity: Not on file  Other Topics Concern   Not on file  Social History Narrative   Not on file   Social Drivers of Health   Tobacco Use: Medium Risk (12/15/2024)   Patient History    Smoking Tobacco Use: Former    Smokeless Tobacco Use: Never    Passive Exposure: Not on Actuary Strain: Not on file  Food Insecurity: Not on file  Transportation Needs: Not on file  Physical Activity: Not on file  Stress: Not on file  Social Connections: Not on file  Intimate Partner Violence: Not on file  Depression (EYV7-0): Not on file  Alcohol Screen: Not on file  Housing: Not on file  Utilities: Not on file  Health Literacy: Not on file     Review of Systems: Review of Systems  Constitutional:  Positive for malaise/fatigue.  Gastrointestinal:  Positive for abdominal pain, blood in stool and melena.  Genitourinary:  Positive for dysuria.  Neurological:  Positive for weakness.     Vital Signs: Blood pressure 114/63, pulse 78, temperature 99 F (37.2 C), temperature  source Oral, resp. rate 18, height 5' 2 (1.575 m), weight 59 kg, SpO2 100%.  Weight trends: Filed Weights   12/14/24 1901  Weight: 59 kg  Physical Exam: General: No acute distress  Head: Normocephalic, atraumatic. Moist oral mucosal membranes  Eyes: Anicteric  Neck: Supple  Lungs:  Clear to auscultation, normal effort  Heart: S1S2 no rubs  Abdomen:  Soft, nontender, bowel sounds present  Extremities: No peripheral edema.  Neurologic: Awake, alert, following commands  Skin: No acute rash  Access: No hemodialysis access    Lab results: Basic Metabolic Panel: Recent Labs  Lab 12/14/24 1918 12/14/24 2333 12/15/24 0509  NA 136 139 139  K 6.5* 5.1 5.2*  CL 96* 103 103  CO2 14* 18* 19*  GLUCOSE 144* 63* 89  BUN 135* 126* 109*  CREATININE 18.50* 16.20* 12.30*  CALCIUM  8.4* 8.2* 8.1*    Liver Function Tests: Recent Labs  Lab 12/14/24 1918  AST 17  ALT 15  ALKPHOS 45  BILITOT 0.2  PROT 6.5  ALBUMIN 3.6   No results for input(s): LIPASE, AMYLASE in the last 168 hours. No results for input(s): AMMONIA in the last 168 hours.  CBC: Recent Labs  Lab 12/14/24 1918 12/15/24 0107 12/15/24 0509  WBC 12.7* 12.1* 12.5*  HGB 12.9* 8.5* 8.8*  HCT 35.9* 24.3* 25.4*  MCV 90.7 92.0 92.0  PLT 267 189 211    Cardiac Enzymes: No results for input(s): CKTOTAL, CKMB, CKMBINDEX, TROPONINI in the last 168 hours.  BNP: Invalid input(s): POCBNP  CBG: Recent Labs  Lab 12/14/24 2102 12/15/24 0442 12/15/24 0554  GLUCAP 116* 61* 86    Microbiology: Results for orders placed or performed in visit on 09/13/15  Microscopic Examination     Status: Abnormal   Collection Time: 09/13/15 10:07 AM   URINE  Result Value Ref Range Status   WBC, UA 0-5 0 - 5 /hpf Final   RBC, UA None seen 0 - 2 /hpf Final   Epithelial Cells (non renal) None seen 0 - 10 /hpf Final   Mucus, UA Present (A) Not Estab. Final   Bacteria, UA None seen None seen/Few Final     Coagulation Studies: No results for input(s): LABPROT, INR in the last 72 hours.  Urinalysis: Recent Labs    12/15/24 0050  COLORURINE YELLOW*  LABSPEC 1.010  PHURINE 6.0  GLUCOSEU NEGATIVE  HGBUR MODERATE*  BILIRUBINUR NEGATIVE  KETONESUR NEGATIVE  PROTEINUR NEGATIVE  NITRITE NEGATIVE  LEUKOCYTESUR NEGATIVE      Imaging: NM GI Blood Loss Result Date: 12/15/2024 EXAM: NM GI Bleeding Scan. CLINICAL HISTORY: 838436 Lower GI bleed 838436 Lower GI bleed TECHNIQUE: Dynamic anterior images of the abdomen and pelvis were obtained during the time of radio-labeled autologous red blood cells. Images were acquired for 2 hours. RADIOPHARMACEUTICAL: 21.31 millicurie (Technetium-labeled red blood cells (ULTRATAG ) injection kit 21.31 millicurie TECHNETIUM TC 99M -LABELED RED BLOOD CELLS IV KIT). COMPARISON: CT 12/14/2024. FINDINGS: STOMACH AND BOWEL: No tagged red blood cell activity to localize active bleeding into the gastrointestinal tract. No evidence of active gastrointestinal bleeding. OTHER VISCERA: Physiologic activity in the liver, vasculature, and bladder noted. IMPRESSION: 1. No evidence of active gastrointestinal bleeding. Electronically signed by: Norleen Boxer MD 12/15/2024 02:36 PM EST RP Workstation: [id number]   CT ABDOMEN PELVIS WO CONTRAST Result Date: 12/14/2024 EXAM: CT ABDOMEN AND PELVIS WITHOUT CONTRAST 12/14/2024 08:36:17 PM TECHNIQUE: CT of the abdomen and pelvis was performed without the administration of intravenous contrast. Multiplanar reformatted images are provided for review. Automated exposure control, iterative reconstruction, and/or weight-based adjustment of the mA/kV was utilized to reduce the radiation dose to as low as reasonably achievable. COMPARISON: None available. CLINICAL  HISTORY: abd pain FINDINGS: LOWER CHEST: Round and subpleural 2.2 x 1.1 cm subpleural pulmonary mass is seen within the visualized right lung base demonstrating speckled internal  calcifications which may represent sequelae of prior inflammation or a mass such as a pulmonary hematoma, but is indeterminate. Comparison with prior examination, if available, will be helpful in determining the stability. If none are available, dedicated non-emergent CT imaging is recommended in 1 month to assess for interval change and establish a baseline for continued surveillance. Extensive coronary artery calcifications. LIVER: The liver is unremarkable. GALLBLADDER AND BILE DUCTS: Gallbladder is unremarkable. No biliary ductal dilatation. SPLEEN: No acute abnormality. PANCREAS: No acute abnormality. ADRENAL GLANDS: No acute abnormality. KIDNEYS, URETERS AND BLADDER: There is marked prostatic hypertrophy noted. There is marked distention of the bladder which measures 11.8 x 9.4 x 15.8 cm. Volume is 1760 mL. 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 There is mild bilateral hydronephrosis, likely the result of bladder outlet obstruction. No superimposed intrarenal or ureteral calculi. There is moderate bilateral superimposed perinephric inflammatory stranding, right greater than left, which may reflect a superimposed inflammatory process, most commonly ascending urinary tract infection. Correlation with urinalysis and urine culture may be helpful for further management. No perinephric fluid collections are seen. GI AND BOWEL: Moderate descending and sigmoid colonic diverticulosis. The stomach, small bowel, and large bowel are otherwise unremarkable. Appendix is normal. There is no bowel obstruction. PERITONEUM AND RETROPERITONEUM: Sinus fat containing umbilical hernia. No ascites. No free air. VASCULATURE: Extensive aortoiliac atherosclerotic calcification. Aorta is normal in caliber. LYMPH NODES: No lymphadenopathy. REPRODUCTIVE ORGANS: No acute abnormality. BONES AND SOFT TISSUES: Benign bone island within the left  ileum. Osseous structures are age appropriate. No acute bone abnormality. No lytic or blastic bone lesion. No focal soft tissue abnormality. RAF SCORE: Aortic atherosclerosis (ICD-10: I70.0) IMPRESSION: 1. Indeterminate 2.2 x 1.1 cm subpleural pulmonary mass with speckled internal calcifications in the right lung base; dedicated non-emergent chest CT in 1 month recommended to assess for interval change and establish a baseline if no prior imaging is available. 2. marked distention of the bladder with an estimated volume of 1760 ml with mild bilateral hydronephrosis, most likely secondary to bladder outlet obstruction. 3. Moderate bilateral perinephric inflammatory stranding, possibly related to superimposed ascending urinary tract infection. Correlation with urinalysis and urine culture may be helpful for further management. 4. Extensive coronary artery calcifications. Marked distention of the bladder measuring 11.8 x 9.4 x 15.8 cm with mild bilateral hydronephrosis, most likely secondary to bladder outlet obstruction. Moderate bilateral perinephric inflammatory stranding, greater on the right, which may reflect superimposed ascending urinary tract infection; correlation with urinalysis and urine culture may be helpful for further management. No intrarenal or ureteral calculi identified. Moderate descending and sigmoid colonic diverticulosis without imaging evidence of diverticulitis. Extensive aortoiliac atherosclerotic calcification. Extensive coronary artery calcifications. Small left inferolateral congenital bladder diverticulum. Sinus fat-containing umbilical hernia. Raf score: Aortic atherosclerosis (icd10-i70.0). Electronically signed by: Dorethia Molt MD 12/14/2024 08:50 PM EST RP Workstation: [id number]   DG Chest 1 View Result Date: 12/14/2024 EXAM: 1 VIEW(S) XRAY OF THE CHEST 12/14/2024 07:53:24 PM COMPARISON: 01/12/2014 CLINICAL HISTORY: SOB. FINDINGS: LUNGS AND PLEURA: No focal pulmonary opacity. No  pleural effusion. No pneumothorax. HEART AND MEDIASTINUM: No acute abnormality of the cardiac and mediastinal silhouettes. BONES AND SOFT TISSUES: No acute osseous abnormality. IMPRESSION: 1. No acute process. Electronically signed by: Morgane Naveau MD 12/14/2024 07:58 PM EST RP  Workstation: [id number]     Assessment & Plan: Pt is a 78 y.o. male with a PMHx of hypertension, chronic kidney disease stage II, nephrolithiasis, peripheral arterial disease, osteoarthritis, history of tobacco abuse, who was admitted to [facility] on 12/14/2024 for evaluation of abdominal pain, weakness, melena, and difficulty urinating x 2 weeks.   1.  Acute kidney injury/chronic kidney disease stage II baseline eGFR 63.  Acute kidney injury now due to hydronephrosis and bladder outlet obstruction.  Foley catheter was placed and renal function began improving almost immediately after placement.  Therefore no urgent indication for dialysis at the moment.  Continue to monitor renal parameters, urine output postplacement of Foley.  2.  Hyperkalemia.  Serum potassium down to 5.2 this a.m.  Suspect that this will continue to improve with ongoing Foley drainage.  3.  Acute metabolic acidosis.  Serum bicarbonate 14 upon admission. Serum bicarbonate improved to 19.  This should improve with Foley drainage.  4.  Thanks for consultation.       [1] No Known Allergies

## 2024-12-15 NOTE — Assessment & Plan Note (Signed)
 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

## 2024-12-15 NOTE — Op Note (Signed)
 [facility] Patient Name: Albert Villarreal Procedure Date: 12/15/2024 1:59 PM MRN: [id number] Account #: [id number] Date of Birth: 1946-11-09 Admit Type: Inpatient Age: 78 Room: [facility] ENDO ROOM 4 Gender: Male Note Status: Finalized Instrument Name: Endoscope [id number] Procedure:             Upper GI endoscopy Indications:           Melena Providers:             Rogelia Copping MD, MD Referring MD:          [facility] Medicines:             Propofol  per Anesthesia Complications:         No immediate complications. Procedure:             Pre-Anesthesia Assessment:                        - Prior to the procedure, a History and Physical was                         performed, and patient medications and allergies were                         reviewed. The patient's tolerance of previous                         anesthesia was also reviewed. The risks and benefits                         of the procedure and the sedation options and risks                         were discussed with the patient. All questions were                         answered, and informed consent was obtained. Prior                         Anticoagulants: The patient has taken no anticoagulant                         or antiplatelet agents. ASA Grade Assessment: II - A                         patient with mild systemic disease. After reviewing                         the risks and benefits, the patient was deemed in                         satisfactory condition to undergo the procedure.                        After obtaining informed consent, the endoscope was                         passed under direct vision. Throughout the procedure,  the patient's blood pressure, pulse, and oxygen                         saturations were monitored continuously. The Endoscope                         was introduced through the mouth, and advanced to the                          second part of duodenum. The upper GI endoscopy was                         accomplished without difficulty. The patient tolerated                         the procedure well. Findings:      The examined esophagus was normal.      The esophagus was normal.      Mild inflammation characterized by erosions was found in the gastric       antrum.      Mild inflammation was found in the duodenal bulb.      A non-bleeding diverticulum was found in the second portion of the       duodenum. Impression:            - Normal esophagus.                        - Normal esophagus.                        - Gastritis.                        - Duodenitis.                        - Non-bleeding duodenal diverticulum.                        - No specimens collected. Recommendation:        - Return patient to hospital ward for ongoing care.                        - Resume previous diet.                        - Continue present medications.                        - Waiting for the bleeding scan results and will                         consider a colonoscopy if no source seen. Procedure Code(s):     --- Professional ---                        [phone], Esophagogastroduodenoscopy, flexible,                         transoral; diagnostic, including collection of  specimen(s) by brushing or washing, when performed                         (separate procedure) Diagnosis Code(s):     --- Professional ---                        K92.1, Melena (includes Hematochezia)                        K29.70, Gastritis, unspecified, without bleeding CPT copyright 2022 American Medical Association. All rights reserved. The codes documented in this report are preliminary and upon coder review may  be revised to meet current compliance requirements. Rogelia Copping MD, MD 12/15/2024 2:38:52 PM This report has been signed electronically. Number of Addenda: 0 Note Initiated On: 12/15/2024 1:59 PM Estimated  Blood Loss:  Estimated blood loss: none.      [facility]

## 2024-12-15 NOTE — ED Notes (Signed)
 Pt returned from [place] at this time. Endo called and notified that pt was back.

## 2024-12-15 NOTE — Transfer of Care (Signed)
 Immediate Anesthesia Transfer of Care Note  Patient: Albert Villarreal  Procedure(s) Performed: EGD (ESOPHAGOGASTRODUODENOSCOPY)  Patient Location: PACU  Anesthesia Type:General  Level of Consciousness: sedated  Airway & Oxygen Therapy: Patient Spontanous Breathing  Post-op Assessment: Report given to RN and Post -op Vital signs reviewed and stable  Post vital signs: Reviewed and stable  Last Vitals:  Vitals Value Taken Time  BP 92/60 12/15/24 14:38  Temp    Pulse 113 12/15/24 14:41  Resp 33 12/15/24 14:41  SpO2 95 % 12/15/24 14:41  Vitals shown include unfiled device data.  Last Pain:  Vitals:   12/15/24 1438  TempSrc:   PainSc: Asleep         Complications: No notable events documented.

## 2024-12-15 NOTE — Consult Note (Signed)
 Albert Copping, MD [facility]  [address] [place], [place] [address] Phone: [phone] Fax : [phone]  Consultation  Referring Provider:     Dr. Cleatus Primary Care Physician:  Albert Villarreal Albert Villarreal Primary Gastroenterologist: Albert Villarreal         Reason for Consultation:     GI bleed  Date of Admission:  12/14/2024 Date of Consultation:  12/15/2024         HPI:   Albert Villarreal is a 78 y.o. male who has a history of hypertension chronic kidney disease with a report of hematochezia.  The patient states that he had a history of a GI bleed with hematemesis when he had been treated for ITP back in 2014.  The patient was using NSAIDs at that time but states that he is not taking any anti-inflammatory medications at the present time.  The patient presented to the ED with hypotension and a report of black stools.  He denies any abdominal pain.  He states that he has had multiple bowel movements with black stools since this started yesterday but his dark stool started approximately 1-1/2 weeks ago.  There is no report of any abdominal pain or hematemesis.  He also denies any bright red blood per rectum. The patient denies being on any blood thinners.  His blood work showed:  Component     Latest Ref Rng 12/14/2024 12/15/2024  Hemoglobin     13.0 - 17.0 g/dL 87.0 (L)  8.8 (L)   Hemoglobin       8.5 (L)   HCT     39.0 - 52.0 % 35.9 (L)  25.4 (L)   HCT       24.3 (L)    The patient has CT scan of the abdomen pelvis with contrast yesterday that showed:  IMPRESSION: 1. Indeterminate 2.2 x 1.1 cm subpleural pulmonary mass with speckled internal calcifications in the right lung base; dedicated non-emergent chest CT in 1 month recommended to assess for interval change and establish a baseline if no prior imaging is available. 2. marked distention of the bladder with an estimated volume of 1760 ml with mild bilateral hydronephrosis, most likely secondary to bladder  outlet obstruction. 3. Moderate bilateral perinephric inflammatory stranding, possibly related to superimposed ascending urinary tract infection. Correlation with urinalysis and urine culture may be helpful for further management. 4. Extensive coronary artery calcifications.  I am now being asked to see the patient for his drop in hemoglobin and his GI bleed.  Past Medical History:  Diagnosis Date   Arthritis    Chronic kidney disease    Chronic kidney disease, stage 3 (HCC) 06/15/2015   Claudication of right lower extremity    HTN (hypertension) 06/15/2015   Hypertension    Kidney stones    PAD (peripheral artery disease) 08/24/2015    Past Surgical History:  Procedure Laterality Date   NO PAST SURGERIES      Prior to Admission medications  Medication Sig Start Date End Date Taking? Authorizing Provider  lisinopril (PRINIVIL,ZESTRIL) 20 MG tablet Take 20 mg by mouth daily.   Yes [provider]  naproxen  (NAPROSYN ) 500 MG tablet TAKE ONE TABLET BY MOUTH TWICE DAILY WITH MEALS 07/20/24  Yes Albert Lin, MD  rosuvastatin (CRESTOR) 10 MG tablet Take 10 mg by mouth daily.   Yes [provider]  tamsulosin (FLOMAX) 0.4 MG CAPS capsule Take 0.4 mg by mouth daily.   Yes [provider]  aspirin   EC 81 MG tablet Take 1 tablet (81 mg total) by mouth daily. Patient not taking: Reported on 12/14/2024 08/24/15   Albert Villarreal LABOR, MD  atorvastatin  (LIPITOR) 20 MG tablet Take 1 tablet (20 mg total) by mouth daily. Patient not taking: Reported on 12/14/2024 09/05/15   Albert Villarreal LABOR, MD  cilostazol  (PLETAL ) 50 MG tablet Take 1 tablet (50 mg total) by mouth 2 (two) times daily. Patient not taking: Reported on 02/18/2023 11/20/15   Albert Villarreal LABOR, MD  methocarbamol  (ROBAXIN ) 500 MG tablet TAKE ONE TABLET BY MOUTH EVERY 8 HOURS AS NEEDED FOR MUSCLE SPASMS Patient not taking: Reported on 12/14/2024 10/12/23   Albert Lin, MD    Family History  Problem Relation  Age of Onset   Hypertension Mother    Hypertension Father    Prostate cancer Neg Hx    Bladder Cancer Neg Hx    Kidney cancer Neg Hx    Kidney disease Neg Hx      Social History[1]  Allergies as of 12/14/2024   (No Known Allergies)    Review of Systems:    All systems reviewed and negative except where noted in HPI.   Physical Exam:  Vital signs in last 24 hours: Temp:  [97.4 F (36.3 C)-98.5 F (36.9 C)] 98.5 F (36.9 C) (12/18 0752) Pulse Rate:  [47-103] 72 (12/18 0752) Resp:  [9-25] 17 (12/18 0752) BP: (87-147)/(49-107) 136/66 (12/18 0752) SpO2:  [91 %-100 %] 100 % (12/18 0752) Weight:  [59 kg] 59 kg (12/17 1901)   General:   Pleasant, cooperative in NAD Head:  Normocephalic and atraumatic. Eyes:   No icterus.   Conjunctiva pink. PERRLA. Ears:  Normal auditory acuity. Neck:  Supple; no masses or thyroidomegaly Lungs: Respirations even and unlabored. Lungs clear to auscultation bilaterally.   No wheezes, crackles, or rhonchi.  Heart:  Regular rate and rhythm;  Without murmur, clicks, rubs or gallops Abdomen:  Soft, nondistended, nontender. Normal bowel sounds. No appreciable masses or hepatomegaly.  No rebound or guarding.  Rectal:  Not performed. Msk:  Symmetrical without gross deformities.    Extremities:  Without edema, cyanosis or clubbing. Neurologic:  Alert and oriented x3;  grossly normal neurologically. Skin:  Intact without significant lesions or rashes. Cervical Nodes:  No significant cervical adenopathy. Psych:  Alert and cooperative. Normal affect.  LAB RESULTS: Recent Labs    12/14/24 1918 12/15/24 0107 12/15/24 0509  WBC 12.7* 12.1* 12.5*  HGB 12.9* 8.5* 8.8*  HCT 35.9* 24.3* 25.4*  PLT 267 189 211   BMET Recent Labs    12/14/24 1918 12/14/24 2333 12/15/24 0509  NA 136 139 139  K 6.5* 5.1 5.2*  CL 96* 103 103  CO2 14* 18* 19*  GLUCOSE 144* 63* 89  BUN 135* 126* 109*  CREATININE 18.50* 16.20* 12.30*  CALCIUM  8.4* 8.2* 8.1*    LFT Recent Labs    12/14/24 1918  PROT 6.5  ALBUMIN 3.6  AST 17  ALT 15  ALKPHOS 45  BILITOT 0.2   PT/INR No results for input(s): LABPROT, INR in the last 72 hours.  STUDIES: CT ABDOMEN PELVIS WO CONTRAST Result Date: 12/14/2024 EXAM: CT ABDOMEN AND PELVIS WITHOUT CONTRAST 12/14/2024 08:36:17 PM TECHNIQUE: CT of the abdomen and pelvis was performed without the administration of intravenous contrast. Multiplanar reformatted images are provided for review. Automated exposure control, iterative reconstruction, and/or weight-based adjustment of the mA/kV was utilized to reduce the radiation dose to as low as reasonably achievable. COMPARISON: None available.  CLINICAL HISTORY: abd pain FINDINGS: LOWER CHEST: Round and subpleural 2.2 x 1.1 cm subpleural pulmonary mass is seen within the visualized right lung base demonstrating speckled internal calcifications which may represent sequelae of prior inflammation or a mass such as a pulmonary hematoma, but is indeterminate. Comparison with prior examination, if available, will be helpful in determining the stability. If none are available, dedicated non-emergent CT imaging is recommended in 1 month to assess for interval change and establish a baseline for continued surveillance. Extensive coronary artery calcifications. LIVER: The liver is unremarkable. GALLBLADDER AND BILE DUCTS: Gallbladder is unremarkable. No biliary ductal dilatation. SPLEEN: No acute abnormality. PANCREAS: No acute abnormality. ADRENAL GLANDS: No acute abnormality. KIDNEYS, URETERS AND BLADDER: There is marked prostatic hypertrophy noted. There is marked distention of the bladder which measures 11.8 x 9.4 x 15.8 cm. Volume is 1760 mL. 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 There is mild bilateral hydronephrosis, likely the result of bladder outlet obstruction. No superimposed  intrarenal or ureteral calculi. There is moderate bilateral superimposed perinephric inflammatory stranding, right greater than left, which may reflect a superimposed inflammatory process, most commonly ascending urinary tract infection. Correlation with urinalysis and urine culture may be helpful for further management. No perinephric fluid collections are seen. GI AND BOWEL: Moderate descending and sigmoid colonic diverticulosis. The stomach, small bowel, and large bowel are otherwise unremarkable. Appendix is normal. There is no bowel obstruction. PERITONEUM AND RETROPERITONEUM: Sinus fat containing umbilical hernia. No ascites. No free air. VASCULATURE: Extensive aortoiliac atherosclerotic calcification. Aorta is normal in caliber. LYMPH NODES: No lymphadenopathy. REPRODUCTIVE ORGANS: No acute abnormality. BONES AND SOFT TISSUES: Benign bone island within the left ileum. Osseous structures are age appropriate. No acute bone abnormality. No lytic or blastic bone lesion. No focal soft tissue abnormality. RAF SCORE: Aortic atherosclerosis (ICD-10: I70.0) IMPRESSION: 1. Indeterminate 2.2 x 1.1 cm subpleural pulmonary mass with speckled internal calcifications in the right lung base; dedicated non-emergent chest CT in 1 month recommended to assess for interval change and establish a baseline if no prior imaging is available. 2. marked distention of the bladder with an estimated volume of 1760 ml with mild bilateral hydronephrosis, most likely secondary to bladder outlet obstruction. 3. Moderate bilateral perinephric inflammatory stranding, possibly related to superimposed ascending urinary tract infection. Correlation with urinalysis and urine culture may be helpful for further management. 4. Extensive coronary artery calcifications. Marked distention of the bladder measuring 11.8 x 9.4 x 15.8 cm with mild bilateral hydronephrosis, most likely secondary to bladder outlet obstruction. Moderate bilateral perinephric  inflammatory stranding, greater on the right, which may reflect superimposed ascending urinary tract infection; correlation with urinalysis and urine culture may be helpful for further management. No intrarenal or ureteral calculi identified. Moderate descending and sigmoid colonic diverticulosis without imaging evidence of diverticulitis. Extensive aortoiliac atherosclerotic calcification. Extensive coronary artery calcifications. Small left inferolateral congenital bladder diverticulum. Sinus fat-containing umbilical hernia. Raf score: Aortic atherosclerosis (icd10-i70.0). Electronically signed by: Dorethia Molt MD 12/14/2024 08:50 PM EST RP Workstation: [id number]   DG Chest 1 View Result Date: 12/14/2024 EXAM: 1 VIEW(S) XRAY OF THE CHEST 12/14/2024 07:53:24 PM COMPARISON: 01/12/2014 CLINICAL HISTORY: SOB. FINDINGS: LUNGS AND PLEURA: No focal pulmonary opacity. No pleural effusion. No pneumothorax. HEART AND MEDIASTINUM: No acute abnormality of the cardiac and mediastinal silhouettes. BONES AND SOFT TISSUES: No acute osseous abnormality. IMPRESSION: 1. No acute process. Electronically signed by: Morgane Naveau MD 12/14/2024 07:58 PM EST RP  Workstation: [id number]      Impression / Plan:   Assessment: Principal Problem:   Acute renal failure superimposed on stage 3a chronic kidney disease (HCC) Active Problems:   HTN (hypertension)   PAD (peripheral artery disease)   Hematochezia   Pleural mass   Bladder outlet obstruction   Bilateral hydronephrosis   Atherosclerosis   High anion gap metabolic acidosis   History of ITP   SIRS (systemic inflammatory response syndrome) (HCC)   Acute pyelonephritis   Hyperkalemia   Anemia   ABLA (acute blood loss anemia)   Albert Villarreal is a 78 y.o. y/o male with this patient has had dark stools with a drop in hemoglobin.  The patient has been transfused and is now laying in bed in no apparent distress.  The patient denies any NSAID use or blood thinners  for the present time.  Plan:  The patient will be brought to the endoscopy unit for a upper endoscopy today due to his acute drop in hemoglobin and dark stools indicating a possible upper GI bleed.  The patient has been explained the plan and agrees with it.  PPI IV twice daily  Continue serial CBCs and transfuse PRN Avoid NSAIDs Maintain 2 large-bore IV lines Please page GI with any acute hemodynamic changes, or signs of active GI bleeding   Thank you for involving me in the care of this patient.      LOS: 1 day   Albert Copping, MD, MD. NOLIA 12/15/2024, 11:17 AM,  Pager [phone] 7am-5pm  Check AMION for 5pm -7am coverage and on weekends   Note: This dictation was prepared with Dragon dictation along with smaller phrase technology. Any transcriptional errors that result from this process are unintentional.       [1]  Social History Tobacco Use   Smoking status: Former    Current packs/day: 0.00    Types: Cigarettes    Quit date: 12/29/2012    Years since quitting: 11.9   Smokeless tobacco: Never  Substance Use Topics   Alcohol use: No    Alcohol/week: 0.0 standard drinks of alcohol   Drug use: No

## 2024-12-15 NOTE — Assessment & Plan Note (Signed)
 Potassium 6.5 Received meds in the ED Check BMP every 4 when treated as needed Continuous cardiac monitoring

## 2024-12-15 NOTE — Plan of Care (Signed)

## 2024-12-15 NOTE — Assessment & Plan Note (Addendum)
 High anion gap metabolic acidosis Obstructive uropathy Bladder outlet obstruction with mild bilateral hydronephrosis History of elevated PSA Etiology suspect renal and post renal Continue Foley decompression Monitor renal function and avoid nephrotoxins Sodium bicarb infusion Nephrology and urology consults

## 2024-12-15 NOTE — Anesthesia Preprocedure Evaluation (Signed)
 Anesthesia Evaluation  Patient identified by MRN, date of birth, ID band Patient awake    Reviewed: Allergy & Precautions, NPO status , Patient's Chart, lab work & pertinent test results  History of Anesthesia Complications Negative for: history of anesthetic complications  Airway Mallampati: III  TM Distance: >3 FB Neck ROM: full    Dental  (+) Chipped, Poor Dentition, Missing   Pulmonary neg shortness of breath, former smoker   Pulmonary exam normal        Cardiovascular hypertension, (-) angina + Peripheral Vascular Disease  Normal cardiovascular exam     Neuro/Psych  Neuromuscular disease  negative psych ROS   GI/Hepatic negative GI ROS, Neg liver ROS,neg GERD  ,,  Endo/Other  negative endocrine ROS    Renal/GU ARF and CRFRenal disease  negative genitourinary   Musculoskeletal   Abdominal   Peds  Hematology  (+) Blood dyscrasia, anemia   Anesthesia Other Findings Patient is NPO appropriate and reports no nausea or vomiting today.  Past Medical History: No date: Arthritis No date: Chronic kidney disease 06/15/2015: Chronic kidney disease, stage 3 (HCC) No date: Claudication of right lower extremity 06/15/2015: HTN (hypertension) No date: Hypertension No date: Kidney stones 08/24/2015: PAD (peripheral artery disease)  Past Surgical History: No date: NO PAST SURGERIES  BMI    Body Mass Index: 23.78 kg/m      Reproductive/Obstetrics negative OB ROS                              Anesthesia Physical Anesthesia Plan  ASA: 3  Anesthesia Plan: General   Post-op Pain Management:    Induction: Intravenous  PONV Risk Score and Plan: Propofol  infusion and TIVA  Airway Management Planned: Natural Airway and Nasal Cannula  Additional Equipment:   Intra-op Plan:   Post-operative Plan:   Informed Consent: I have reviewed the patients History and Physical, chart, labs and  discussed the procedure including the risks, benefits and alternatives for the proposed anesthesia with the patient or authorized representative who has indicated his/her understanding and acceptance.     Dental Advisory Given  Plan Discussed with: Anesthesiologist, CRNA and Surgeon  Anesthesia Plan Comments: (Patient and family member consented for risks of anesthesia including but not limited to:  - adverse reactions to medications - risk of airway placement if required - damage to eyes, teeth, lips or other oral mucosa - nerve damage due to positioning  - sore throat or hoarseness - Damage to heart, brain, nerves, lungs, other parts of body or loss of life  They voiced understanding and assent.)        Anesthesia Quick Evaluation

## 2024-12-15 NOTE — Hospital Course (Signed)
 SABRA

## 2024-12-15 NOTE — Progress Notes (Signed)
 Progress Note   Patient: Albert Villarreal FMW:[id number] DOB: [birth year] DOA: 12/14/2024     1 DOS: the patient was seen and examined on 12/15/2024   Brief hospital course: Reymundo Winship is a 78 y.o. male with medical history significant for HTN, CKD 3, PAD, being admitted with hematochezia, acute on chronic renal failure with metabolic acidosis /bladder outlet obstruction.  Chart review reveals a history in 2014 of hospitalization for hematemesis/severe thrombocytopenia(Plt 3000)/AKI(Cr 4) in the setting of NSAID use(gastritis on EGD 2014) . 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  Today he presents with a 10-day history of blood in his stool associated with abdominal pain and generalized weakness.  He denies blood in the urine or dysuria.  Has a history of elevated BPH in 2021 but did not follow through for recommended prostate biopsy. In the ED, he was hypotensive at 87/58 with pulse in the 90s, afebrile.  Intermittently hypoxic to 88% on room air for which she was placed on O2 at 2 L.   Blood work with significant abnormalities including creatinine 18 with bicarb 14 and anion gap of 26 and potassium 6.5 Hemoglobin 12.9 with no recent baseline, platelets normal at 267 WBC 12.7, no lactic acid done No UA performed EKG showed sinus at 95 with peaked T waves CT abdomen and pelvis showed marked bladder distention with mild bilateral hydronephrosis secondary to bladder outlet obstruction, moderate bilateral perinephric stranding, extensive atherosclerosis including coronary artery calcification and an indeterminate subpleural pulmonary mass.-Please see report for details Patient treated with NS boluses, Lokelma , Veltassa , insulin  glucose and calcium  gluconate Admission requested.    Assessment and Plan: Acute renal failure superimposed on stage 3a chronic kidney disease (HCC) High anion gap metabolic acidosis Obstructive uropathy Bladder outlet obstruction with mild bilateral  hydronephrosis History of elevated PSA Etiology suspect renal and post renal Continue Foley decompression Monitor renal function and avoid nephrotoxins Case discussed with urology    Hyperkalemia Potassium 6.5 Potassium improved   Hematochezia Acute blood loss anemia History of hematemesis (gastritis on EGD, 2014) Hemoglobin 12.9 with no recent baseline--->8.5 Bleeding scan did not show any active bleeding Patient underwent EGD by GI today that showed gastritis, duodenitis and nonbleeding duodenal diverticulum   Acute pyelonephritis SIRS with hypotension Patient with hypotension, tachycardia, O2 sats 88, leukocytosis  CT abdomen and pelvis showing perinephric stranding Received 2 L IV fluids in the ED Continue IV Rocephin  Follow-up on urine cultures   Bladder outlet obstruction Bilateral hydronephrosis History of elevated PSA with recommendation for prostate biopsy Continue Foley decompression Urology consult   PAD (peripheral artery disease) Extensive atherosclerosis/coronary atherosclerosis on CT Previously on aspirin , atorvastatin  Patient will benefit from ischemic evaluation Consider resuming aspirin  and atorvastatin  during hospitalization   Pleural mass Subpleural mass with recommendation for follow-up CT in 1 month   HTN (hypertension) Holding antihypertensives due to hypotension   History of ITP History of ITP 2014 with platelets as low as 3000 requiring platelet transfusion, treated with steroid Had bone marrow biopsy and briefly followed with oncology Platelet count currently normal     DVT prophylaxis: SCD   Consults: Nephrology, GI and urology   Advance Care Planning: full code   Family Communication: none   Disposition Plan: Back to previous home environment      Subjective:  Patient seen and examined at bedside this morning Denied worsening abdominal pain chest pain or cough No more GI bleeding  Physical Exam:  Vitals and nursing  note reviewed.  Constitutional:  General: He is not in acute distress. HENT:     Head: Normocephalic and atraumatic.  Cardiovascular:     Rate and Rhythm: Normal rate and regular rhythm.     Heart sounds: Normal heart sounds.  Pulmonary:     Effort: Pulmonary effort is normal.     Breath sounds: Normal breath sounds.  Abdominal:     Palpations: Abdomen is soft.     Tenderness: There is no abdominal tenderness.  Neurological:     Mental Status: Mental status is at baseline.    Vitals:   12/15/24 1438 12/15/24 1448 12/15/24 1514 12/15/24 1705  BP: 92/60 104/64 99/66 114/63  Pulse: (!) 112 (!) 106 89 78  Resp: (!) 27 (!) 23 18   Temp:    99 F (37.2 C)  TempSrc:    Oral  SpO2: 94% 97% 98% 100%  Weight:      Height:        Data Reviewed: Bleeding scan reviewed that did not show any active bleeding    Latest Ref Rng & Units 12/15/2024    5:09 AM 12/15/2024    1:07 AM 12/14/2024    7:18 PM  CBC  WBC 4.0 - 10.5 K/uL 12.5  12.1  12.7   Hemoglobin 13.0 - 17.0 g/dL 8.8  8.5  87.0   Hematocrit 39.0 - 52.0 % 25.4  24.3  35.9   Platelets 150 - 400 K/uL 211  189  267        Latest Ref Rng & Units 12/15/2024    5:09 AM 12/14/2024   11:33 PM 12/14/2024    7:18 PM  BMP  Glucose 70 - 99 mg/dL 89  63  855   BUN 8 - 23 mg/dL 890  873  864   Creatinine 0.61 - 1.24 mg/dL 87.69  83.79  81.49   Sodium 135 - 145 mmol/L 139  139  136   Potassium 3.5 - 5.1 mmol/L 5.2  5.1  6.5   Chloride 98 - 111 mmol/L 103  103  96   CO2 22 - 32 mmol/L 19  18  14    Calcium  8.9 - 10.3 mg/dL 8.1  8.2  8.4       Author: Drue ONEIDA Potter, MD 12/15/2024 5:14 PM  For on call review [email or web address].

## 2024-12-15 NOTE — Assessment & Plan Note (Addendum)
 Acute blood loss anemia History of hematemesis (gastritis on EGD, 2014) Hemoglobin 12.9 with no recent baseline--->8.5 Serial H&H and transfuse if needed Protonix  40 mg IV twice daily GI consult placed Addendum:  -Hemoglobin 12.9--8.5 -Will transfuse 1 unit based on rapidity of hemoglobin drop -Will get bleeding scan and consult IR if evidence of acute bleed

## 2024-12-15 NOTE — ED Notes (Signed)
 Assumed care of pt from [facility]. Introduced self to pt. Pt currently has blood transfusion infusing. Pt noted to be in NAD at this time. Call bell in reach.

## 2024-12-15 NOTE — Assessment & Plan Note (Signed)
 Bilateral hydronephrosis History of elevated PSA with recommendation for prostate biopsy Continue Foley decompression Urology consult

## 2024-12-15 NOTE — H&P (Signed)
 History and Physical    Patient: Albert Villarreal FMW:[id number] DOB: 08-15-1946 DOA: 12/14/2024 DOS: the patient was seen and examined on 12/15/2024 PCP: [name] DASEN, PA-C  Patient coming from: Home  Chief Complaint:  Chief Complaint  Patient presents with   Rectal Bleeding    Dark stools   Abdominal Pain   Weakness    HPI: Albert Villarreal is a 78 y.o. male with medical history significant for HTN, CKD 3, PAD, being admitted with hematochezia, acute on chronic renal failure with metabolic acidosis /bladder outlet obstruction.  Chart review reveals a history in 2014 of hospitalization for hematemesis/severe thrombocytopenia(Plt 3000)/AKI(Cr 4) in the setting of NSAID use(gastritis on EGD 2014) . 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  Today he presents with a 10-day history of blood in his stool associated with abdominal pain and generalized weakness.  He denies blood in the urine or dysuria.  Has a history of elevated BPH in 2021 but did not follow through for recommended prostate biopsy. In the ED, he was hypotensive at 87/58 with pulse in the 90s, afebrile.  Intermittently hypoxic to 88% on room air for which she was placed on O2 at 2 L.   Blood work with significant abnormalities including creatinine 18 with bicarb 14 and anion gap of 26 and potassium 6.5 Hemoglobin 12.9 with no recent baseline, platelets normal at 267 WBC 12.7, no lactic acid done No UA performed EKG showed sinus at 95 with peaked T waves CT abdomen and pelvis showed marked bladder distention with mild bilateral hydronephrosis secondary to bladder outlet obstruction, moderate bilateral perinephric stranding, extensive atherosclerosis including coronary artery calcification and an indeterminate subpleural pulmonary mass.-Please see report for details Patient treated with NS boluses, Lokelma , Veltassa , insulin  glucose and calcium  gluconate Admission requested.     Past Medical History:  Diagnosis Date    Arthritis    Chronic kidney disease    Chronic kidney disease, stage 3 (HCC) 06/15/2015   Claudication of right lower extremity    HTN (hypertension) 06/15/2015   Hypertension    Kidney stones    PAD (peripheral artery disease) 08/24/2015   Past Surgical History:  Procedure Laterality Date   NO PAST SURGERIES     Social History:  reports that he quit smoking about 11 years ago. His smoking use included cigarettes. He has never used smokeless tobacco. He reports that he does not drink alcohol and does not use drugs.  Allergies[1]  Family History  Problem Relation Age of Onset   Hypertension Mother    Hypertension Father    Prostate cancer Neg Hx    Bladder Cancer Neg Hx    Kidney cancer Neg Hx    Kidney disease Neg Hx     Prior to Admission medications  Medication Sig Start Date End Date Taking? Authorizing Provider  lisinopril (PRINIVIL,ZESTRIL) 20 MG tablet Take 20 mg by mouth daily.   Yes [provider]  naproxen  (NAPROSYN ) 500 MG tablet TAKE ONE TABLET BY MOUTH TWICE DAILY WITH MEALS 07/20/24  Yes Brenna Lin, MD  rosuvastatin (CRESTOR) 10 MG tablet Take 10 mg by mouth daily.   Yes [provider]  tamsulosin (FLOMAX) 0.4 MG CAPS capsule Take 0.4 mg by mouth daily.   Yes [provider]  aspirin  EC 81 MG tablet Take 1 tablet (81 mg total) by mouth daily. Patient not taking: Reported on 12/14/2024 08/24/15   Darron Deatrice LABOR, MD  atorvastatin  (LIPITOR) 20 MG tablet Take 1 tablet (20 mg  total) by mouth daily. Patient not taking: Reported on 12/14/2024 09/05/15   Darron Deatrice LABOR, MD  cilostazol  (PLETAL ) 50 MG tablet Take 1 tablet (50 mg total) by mouth 2 (two) times daily. Patient not taking: Reported on 02/18/2023 11/20/15   Darron Deatrice LABOR, MD  methocarbamol  (ROBAXIN ) 500 MG tablet TAKE ONE TABLET BY MOUTH EVERY 8 HOURS AS NEEDED FOR MUSCLE SPASMS Patient not taking: Reported on 12/14/2024 10/12/23   Brenna Lin, MD    Physical  Exam: Vitals:   12/14/24 2300 12/14/24 2340 12/15/24 0000 12/15/24 0015  BP: (!) 101/49 (!) 92/53 (!) 95/58 (!) 114/49  Pulse: 72     Resp: 18 12 (!) 9 11  Temp:      TempSrc:      SpO2: 95%     Weight:      Height:       Physical Exam Vitals and nursing note reviewed.  Constitutional:      General: He is not in acute distress. HENT:     Head: Normocephalic and atraumatic.  Cardiovascular:     Rate and Rhythm: Normal rate and regular rhythm.     Heart sounds: Normal heart sounds.  Pulmonary:     Effort: Pulmonary effort is normal.     Breath sounds: Normal breath sounds.  Abdominal:     Palpations: Abdomen is soft.     Tenderness: There is no abdominal tenderness.  Neurological:     Mental Status: Mental status is at baseline.     Labs on Admission: I have personally reviewed following labs and imaging studies  CBC: Recent Labs  Lab 12/14/24 1918  WBC 12.7*  HGB 12.9*  HCT 35.9*  MCV 90.7  PLT 267   Basic Metabolic Panel: Recent Labs  Lab 12/14/24 1918 12/14/24 2333  NA 136 139  K 6.5* 5.1  CL 96* 103  CO2 14* 18*  GLUCOSE 144* 63*  BUN 135* 126*  CREATININE 18.50* 16.20*  CALCIUM  8.4* 8.2*   GFR: Estimated Creatinine Clearance: 2.9 mL/min (A) (by C-G formula based on SCr of 16.2 mg/dL (H)). Liver Function Tests: Recent Labs  Lab 12/14/24 1918  AST 17  ALT 15  ALKPHOS 45  BILITOT 0.2  PROT 6.5  ALBUMIN 3.6   No results for input(s): LIPASE, AMYLASE in the last 168 hours. No results for input(s): AMMONIA in the last 168 hours. Coagulation Profile: No results for input(s): INR, PROTIME in the last 168 hours. Cardiac Enzymes: No results for input(s): CKTOTAL, CKMB, CKMBINDEX, TROPONINI in the last 168 hours. BNP (last 3 results) No results for input(s): PROBNP in the last 8760 hours. HbA1C: No results for input(s): HGBA1C in the last 72 hours. CBG: Recent Labs  Lab 12/14/24 2102  GLUCAP 116*   Lipid Profile: No  results for input(s): CHOL, HDL, LDLCALC, TRIG, CHOLHDL, LDLDIRECT in the last 72 hours. Thyroid Function Tests: No results for input(s): TSH, T4TOTAL, FREET4, T3FREE, THYROIDAB in the last 72 hours. Anemia Panel: No results for input(s): VITAMINB12, FOLATE, FERRITIN, TIBC, IRON, RETICCTPCT in the last 72 hours. Urine analysis:    Component Value Date/Time   COLORURINE Yellow 01/13/2014 0508   APPEARANCEUR Clear 09/13/2015 1007   LABSPEC 1.017 01/13/2014 0508   PHURINE 5.0 01/13/2014 0508   GLUCOSEU Negative 09/13/2015 1007   GLUCOSEU Negative 01/13/2014 0508   HGBUR Negative 01/13/2014 0508   BILIRUBINUR Negative 09/13/2015 1007   BILIRUBINUR Negative 01/13/2014 0508   KETONESUR Negative 01/13/2014 0508   PROTEINUR Negative 09/13/2015  1007   PROTEINUR Negative 01/13/2014 0508   NITRITE Negative 09/13/2015 1007   NITRITE Negative 01/13/2014 0508   LEUKOCYTESUR Trace (A) 09/13/2015 1007   LEUKOCYTESUR Trace 01/13/2014 0508    Radiological Exams on Admission: CT ABDOMEN PELVIS WO CONTRAST Result Date: 12/14/2024 EXAM: CT ABDOMEN AND PELVIS WITHOUT CONTRAST 12/14/2024 08:36:17 PM TECHNIQUE: CT of the abdomen and pelvis was performed without the administration of intravenous contrast. Multiplanar reformatted images are provided for review. Automated exposure control, iterative reconstruction, and/or weight-based adjustment of the mA/kV was utilized to reduce the radiation dose to as low as reasonably achievable. COMPARISON: None available. CLINICAL HISTORY: abd pain FINDINGS: LOWER CHEST: Round and subpleural 2.2 x 1.1 cm subpleural pulmonary mass is seen within the visualized right lung base demonstrating speckled internal calcifications which may represent sequelae of prior inflammation or a mass such as a pulmonary hematoma, but is indeterminate. Comparison with prior examination, if available, will be helpful in determining the stability. If none are  available, dedicated non-emergent CT imaging is recommended in 1 month to assess for interval change and establish a baseline for continued surveillance. Extensive coronary artery calcifications. LIVER: The liver is unremarkable. GALLBLADDER AND BILE DUCTS: Gallbladder is unremarkable. No biliary ductal dilatation. SPLEEN: No acute abnormality. PANCREAS: No acute abnormality. ADRENAL GLANDS: No acute abnormality. KIDNEYS, URETERS AND BLADDER: There is marked prostatic hypertrophy noted. There is marked distention of the bladder which measures 11.8 x 9.4 x 15.8 cm. Volume is 1760 mL. 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 There is mild bilateral hydronephrosis, likely the result of bladder outlet obstruction. No superimposed intrarenal or ureteral calculi. There is moderate bilateral superimposed perinephric inflammatory stranding, right greater than left, which may reflect a superimposed inflammatory process, most commonly ascending urinary tract infection. Correlation with urinalysis and urine culture may be helpful for further management. No perinephric fluid collections are seen. GI AND BOWEL: Moderate descending and sigmoid colonic diverticulosis. The stomach, small bowel, and large bowel are otherwise unremarkable. Appendix is normal. There is no bowel obstruction. PERITONEUM AND RETROPERITONEUM: Sinus fat containing umbilical hernia. No ascites. No free air. VASCULATURE: Extensive aortoiliac atherosclerotic calcification. Aorta is normal in caliber. LYMPH NODES: No lymphadenopathy. REPRODUCTIVE ORGANS: No acute abnormality. BONES AND SOFT TISSUES: Benign bone island within the left ileum. Osseous structures are age appropriate. No acute bone abnormality. No lytic or blastic bone lesion. No focal soft tissue abnormality. RAF SCORE: Aortic atherosclerosis (ICD-10: I70.0) IMPRESSION: 1. Indeterminate 2.2 x 1.1 cm  subpleural pulmonary mass with speckled internal calcifications in the right lung base; dedicated non-emergent chest CT in 1 month recommended to assess for interval change and establish a baseline if no prior imaging is available. 2. marked distention of the bladder with an estimated volume of 1760 ml with mild bilateral hydronephrosis, most likely secondary to bladder outlet obstruction. 3. Moderate bilateral perinephric inflammatory stranding, possibly related to superimposed ascending urinary tract infection. Correlation with urinalysis and urine culture may be helpful for further management. 4. Extensive coronary artery calcifications. Marked distention of the bladder measuring 11.8 x 9.4 x 15.8 cm with mild bilateral hydronephrosis, most likely secondary to bladder outlet obstruction. Moderate bilateral perinephric inflammatory stranding, greater on the right, which may reflect superimposed ascending urinary tract infection; correlation with urinalysis and urine culture may be helpful for further management. No intrarenal or ureteral calculi identified. Moderate descending and sigmoid colonic diverticulosis without imaging evidence of diverticulitis. Extensive aortoiliac atherosclerotic  calcification. Extensive coronary artery calcifications. Small left inferolateral congenital bladder diverticulum. Sinus fat-containing umbilical hernia. Raf score: Aortic atherosclerosis (icd10-i70.0). Electronically signed by: Dorethia Molt MD 12/14/2024 08:50 PM EST RP Workstation: [id number]   DG Chest 1 View Result Date: 12/14/2024 EXAM: 1 VIEW(S) XRAY OF THE CHEST 12/14/2024 07:53:24 PM COMPARISON: 01/12/2014 CLINICAL HISTORY: SOB. FINDINGS: LUNGS AND PLEURA: No focal pulmonary opacity. No pleural effusion. No pneumothorax. HEART AND MEDIASTINUM: No acute abnormality of the cardiac and mediastinal silhouettes. BONES AND SOFT TISSUES: No acute osseous abnormality. IMPRESSION: 1. No acute process. Electronically signed  by: Morgane Naveau MD 12/14/2024 07:58 PM EST RP Workstation: [id number]   Data Reviewed for HPI: Relevant notes from primary care and specialist visits, past discharge summaries as available in EHR, including Care Everywhere. Prior diagnostic testing as pertinent to current admission diagnoses Updated medications and problem lists for reconciliation ED course, including vitals, labs, imaging, treatment and response to treatment Triage notes, nursing and pharmacy notes and ED provider's notes Notable results as noted above in HPI      Assessment and Plan: * Acute renal failure superimposed on stage 3a chronic kidney disease (HCC) High anion gap metabolic acidosis Obstructive uropathy Bladder outlet obstruction with mild bilateral hydronephrosis History of elevated PSA Etiology suspect renal and post renal Continue Foley decompression Monitor renal function and avoid nephrotoxins Sodium bicarb infusion Nephrology and urology consults    Hyperkalemia Potassium 6.5 Received meds in the ED Check BMP every 4 when treated as needed Continuous cardiac monitoring  Hematochezia Acute blood loss anemia History of hematemesis (gastritis on EGD, 2014) Hemoglobin 12.9 with no recent baseline--->8.5 Serial H&H and transfuse if needed Protonix  40 mg IV twice daily GI consult placed Addendum:  -Hemoglobin 12.9--8.5 -Will transfuse 1 unit based on rapidity of hemoglobin drop -Will get bleeding scan and consult IR if evidence of acute bleed  Acute pyelonephritis SIRS with hypotension Patient with hypotension, tachycardia, O2 sats 88, leukocytosis pending lactic acid CT abdomen and pelvis showing perinephric stranding Follow lactic acid and procalcitonin Received 2 L IV fluids in the ED IV Rocephin  Follow UA and cultures  Bladder outlet obstruction Bilateral hydronephrosis History of elevated PSA with recommendation for prostate biopsy Continue Foley decompression Urology  consult  PAD (peripheral artery disease) Extensive atherosclerosis/coronary atherosclerosis on CT Previously on aspirin , atorvastatin  Patient will benefit from ischemic evaluation Consider resuming aspirin  and atorvastatin  during hospitalization  Pleural mass Subpleural mass with recommendation for follow-up CT in 1 month  HTN (hypertension) Holding antihypertensives due to hypotension  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 Inpatient status is judged to be reasonable and necessary in order to provide the required intensity of service to ensure the patient's safety. The patient's presenting symptoms, physical exam findings, and initial radiographic and laboratory data in the context of their chronic comorbidities is felt to place them at high risk for further clinical deterioration. Furthermore, it is not anticipated that the patient will be medically stable for discharge from the hospital within 2 midnights of admission.   * I certify that at the point of admission it is my clinical judgment  that the patient will require inpatient hospital care spanning beyond 2 midnights from the point of admission due to high intensity of service, high risk for further deterioration and high frequency of surveillance required.* CRITICAL CARE Performed by: Delayne LULLA Solian   Total critical care time: 60 minutes  Critical care time was exclusive of separately billable procedures and treating other patients.  Critical care was necessary to treat or prevent imminent or  life-threatening deterioration.  Critical care was time spent personally by me on the following activities: development of treatment plan with patient and/or surrogate as well as nursing, discussions with consultants, evaluation of patient's response to treatment, examination of patient, obtaining history from patient or surrogate, ordering and performing treatments and interventions, ordering and review of laboratory studies, ordering and review of radiographic studies, pulse oximetry and re-evaluation of patient's condition.  Author: Delayne LULLA Solian, MD 12/15/2024 12:28 AM  For on call review [email or web address].      [1] No Known Allergies

## 2024-12-15 NOTE — Assessment & Plan Note (Addendum)
 Extensive atherosclerosis/coronary atherosclerosis on CT Previously on aspirin , atorvastatin  Patient will benefit from ischemic evaluation Consider resuming aspirin  and atorvastatin  during hospitalization

## 2024-12-15 NOTE — ED Notes (Signed)
 Pt to Endo at this time.

## 2024-12-15 NOTE — Progress Notes (Signed)
 The patient was called to be brought to the endoscopy unit for an upper endoscopy to look for the source of GI bleeding and it appears that the patient has already been sent down to nuclear medicine for a bleeding scan and will be there for some time.  We will wait for that procedure to be completed prior to calling him for the EGD again.

## 2024-12-15 NOTE — Assessment & Plan Note (Signed)
Holding antihypertensives due to hypotension. 

## 2024-12-15 NOTE — Assessment & Plan Note (Addendum)
 SIRS with hypotension Patient with hypotension, tachycardia, O2 sats 88, leukocytosis pending lactic acid CT abdomen and pelvis showing perinephric stranding Follow lactic acid and procalcitonin Received 2 L IV fluids in the ED IV Rocephin  Follow UA and cultures

## 2024-12-15 NOTE — Progress Notes (Signed)
 Anticoagulation monitoring(Lovenox ):  78 yo  male ordered Lovenox  30 mg Q24h    Filed Weights   12/14/24 1901  Weight: 59 kg (130 lb)   BMI 23.78   Lab Results  Component Value Date   CREATININE 16.20 (H) 12/14/2024   CREATININE 18.50 (H) 12/14/2024   CREATININE 1.19 09/13/2015   Estimated Creatinine Clearance: 2.9 mL/min (A) (by C-G formula based on SCr of 16.2 mg/dL (H)). Hemoglobin & Hematocrit     Component Value Date/Time   HGB 12.9 (L) 12/14/2024 1918   HGB 14.4 03/01/2014 1100   HCT 35.9 (L) 12/14/2024 1918   HCT 43.7 03/01/2014 1100     Per Protocol for Patient with estCrcl < 15 ml/min and BMI < 30, will transition to Heparin  5000 units SQ Q8H.

## 2024-12-16 ENCOUNTER — Inpatient Hospital Stay: Admitting: Anesthesiology

## 2024-12-16 ENCOUNTER — Encounter: Admission: EM | Payer: Self-pay | Source: Home / Self Care | Attending: Internal Medicine

## 2024-12-16 ENCOUNTER — Encounter: Payer: Self-pay | Admitting: Gastroenterology

## 2024-12-16 DIAGNOSIS — K922 Gastrointestinal hemorrhage, unspecified: Secondary | ICD-10-CM

## 2024-12-16 DIAGNOSIS — N189 Chronic kidney disease, unspecified: Secondary | ICD-10-CM

## 2024-12-16 DIAGNOSIS — R338 Other retention of urine: Secondary | ICD-10-CM | POA: Diagnosis not present

## 2024-12-16 DIAGNOSIS — K5730 Diverticulosis of large intestine without perforation or abscess without bleeding: Secondary | ICD-10-CM

## 2024-12-16 DIAGNOSIS — K921 Melena: Secondary | ICD-10-CM | POA: Diagnosis not present

## 2024-12-16 DIAGNOSIS — N320 Bladder-neck obstruction: Secondary | ICD-10-CM | POA: Diagnosis not present

## 2024-12-16 DIAGNOSIS — N179 Acute kidney failure, unspecified: Secondary | ICD-10-CM | POA: Diagnosis not present

## 2024-12-16 HISTORY — PX: COLONOSCOPY: SHX5424

## 2024-12-16 SURGERY — COLONOSCOPY
Anesthesia: General

## 2024-12-16 MED ORDER — PROPOFOL 500 MG/50ML IV EMUL
INTRAVENOUS | Status: DC | PRN
  Administered 2024-12-16: 125 ug/kg/min via INTRAVENOUS
  Administered 2024-12-16: 80 mg via INTRAVENOUS

## 2024-12-16 MED ORDER — LIDOCAINE HCL (CARDIAC) PF 100 MG/5ML IV SOSY
PREFILLED_SYRINGE | INTRAVENOUS | Status: DC | PRN
  Administered 2024-12-16: 50 mg via INTRAVENOUS

## 2024-12-16 MED ORDER — PHENYLEPHRINE 80 MCG/ML (10ML) SYRINGE FOR IV PUSH (FOR BLOOD PRESSURE SUPPORT)
PREFILLED_SYRINGE | INTRAVENOUS | Status: AC
  Filled 2024-12-16: qty 10

## 2024-12-16 MED ORDER — PHENYLEPHRINE 80 MCG/ML (10ML) SYRINGE FOR IV PUSH (FOR BLOOD PRESSURE SUPPORT)
PREFILLED_SYRINGE | INTRAVENOUS | Status: DC | PRN
  Administered 2024-12-16 (×2): 80 ug via INTRAVENOUS
  Administered 2024-12-16: 160 ug via INTRAVENOUS

## 2024-12-16 MED ORDER — PROPOFOL 10 MG/ML IV BOLUS
INTRAVENOUS | Status: AC
  Filled 2024-12-16: qty 40

## 2024-12-16 MED ORDER — LIDOCAINE HCL (PF) 2 % IJ SOLN
INTRAMUSCULAR | Status: AC
  Filled 2024-12-16: qty 5

## 2024-12-16 MED ORDER — STERILE WATER FOR IRRIGATION IR SOLN
Status: DC | PRN
  Administered 2024-12-16: 60 mL

## 2024-12-16 NOTE — Plan of Care (Signed)
   Problem: Education: Goal: Knowledge of General Education information will improve Description Including pain rating scale, medication(s)/side effects and non-pharmacologic comfort measures Outcome: Progressing   Problem: Health Behavior/Discharge Planning: Goal: Ability to manage health-related needs will improve Outcome: Progressing

## 2024-12-16 NOTE — Progress Notes (Signed)
 Came by to see patient earlier in the day however patient down for procedure.  Renal function significantly improved with creatinine down to 6.46 status post Foley catheter drainage.  Therefore no indication for dialysis.  Monitor renal parameters closely.

## 2024-12-16 NOTE — Progress Notes (Signed)
 " Progress Note   Patient: Albert Villarreal FMW:[id number] DOB: 20-Jan-1946 DOA: 12/14/2024     2 DOS: the patient was seen and examined on 12/16/2024    Brief hospital course: From HPI Albert Villarreal is a 78 y.o. male with medical history significant for HTN, CKD 3, PAD, being admitted with hematochezia, acute on chronic renal failure with metabolic acidosis /bladder outlet obstruction.  Chart review reveals a history in 2014 of hospitalization for hematemesis/severe thrombocytopenia(Plt 3000)/AKI(Cr 4) in the setting of NSAID use(gastritis on EGD 2014) . 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  Today he presents with a 10-day history of blood in his stool associated with abdominal pain and generalized weakness.  He denies blood in the urine or dysuria.  Has a history of elevated BPH in 2021 but did not follow through for recommended prostate biopsy. In the ED, he was hypotensive at 87/58 with pulse in the 90s, afebrile.  Intermittently hypoxic to 88% on room air for which she was placed on O2 at 2 L.   Blood work with significant abnormalities including creatinine 18 with bicarb 14 and anion gap of 26 and potassium 6.5 Hemoglobin 12.9 with no recent baseline, platelets normal at 267 WBC 12.7, no lactic acid done No UA performed EKG showed sinus at 95 with peaked T waves CT abdomen and pelvis showed marked bladder distention with mild bilateral hydronephrosis secondary to bladder outlet obstruction, moderate bilateral perinephric stranding, extensive atherosclerosis including coronary artery calcification and an indeterminate subpleural pulmonary mass.-Please see report for details Patient treated with NS boluses, Lokelma , Veltassa , insulin  glucose and calcium  gluconate Admission requested.       Assessment and Plan: Acute renal failure superimposed on stage 3a chronic kidney disease (HCC) High anion gap metabolic acidosis Obstructive uropathy Bladder outlet obstruction with mild  bilateral hydronephrosis History of elevated PSA Etiology suspect renal and post renal Continue Foley decompression Monitor renal function and avoid nephrotoxins Case discussed with urology     Hyperkalemia Potassium 6.5 Potassium improved     Hematochezia Acute blood loss anemia History of hematemesis (gastritis on EGD, 2014) Hemoglobin 12.9 with no recent baseline--->8.5 Bleeding scan did not show any active bleeding Patient underwent EGD by GI today that showed gastritis, duodenitis and nonbleeding duodenal diverticulum Underwent colonoscopy by GI today   Acute pyelonephritis SIRS with hypotension Patient with hypotension, tachycardia, O2 sats 88, leukocytosis  CT abdomen and pelvis showing perinephric stranding Received 2 L IV fluids in the ED Continue IV Rocephin  Follow-up on urine cultures   Bladder outlet obstruction Bilateral hydronephrosis History of elevated PSA with recommendation for prostate biopsy Continue Foley decompression Urology consult   PAD (peripheral artery disease) Extensive atherosclerosis/coronary atherosclerosis on CT Previously on aspirin , atorvastatin  Patient will benefit from ischemic evaluation Consider resuming aspirin  and atorvastatin  during hospitalization   Pleural mass Subpleural mass with recommendation for follow-up CT in 1 month   HTN (hypertension) Holding antihypertensives due to hypotension   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  Constitutional:      General: He is not in acute distress. HENT:     Head: Normocephalic and atraumatic.  Cardiovascular:     Rate and Rhythm: Normal rate and regular rhythm.     Heart sounds: Normal heart sounds.  Pulmonary:     Effort: Pulmonary effort is normal.     Breath sounds: Normal breath sounds.  Abdominal:     Palpations: Abdomen is soft.     Tenderness: There is no abdominal tenderness.  Neurological:     Mental Status: Mental status is at baseline.   Data Reviewed: Bleeding scan reviewed that did not show any active bleeding Vitals:   12/16/24 0812 12/16/24 0928 12/16/24 0940 12/16/24 0949  BP: 103/61 (!) 111/56 (!) 104/55 95/60  Pulse: 78 81 82 71  Resp: 16 20 19 16   Temp: (!) 97.2 F (36.2 C) (!) 96.6 F (35.9 C)    TempSrc: Temporal Tympanic    SpO2: 100% 100% 100% 100%  Weight:      Height:          Latest Ref Rng & Units 12/15/2024    6:59 PM 12/15/2024    5:09 AM 12/15/2024    1:07 AM  CBC  WBC 4.0 - 10.5 K/uL 10.1  12.5  12.1   Hemoglobin 13.0 - 17.0 g/dL 89.5  8.8  8.5   Hematocrit 39.0 - 52.0 % 29.0  25.4  24.3   Platelets 150 - 400 K/uL 201  211  189        Latest Ref Rng & Units 12/15/2024    6:59 PM 12/15/2024    5:09 AM 12/14/2024   11:33 PM  BMP  Glucose 70 - 99 mg/dL 834  89  63   BUN 8 - 23 mg/dL 77  890  873   Creatinine 0.61 - 1.24 mg/dL 3.53  87.69  83.79   Sodium 135 - 145 mmol/L 143  139  139   Potassium 3.5 - 5.1 mmol/L 5.1  5.2  5.1   Chloride 98 - 111 mmol/L 108  103  103   CO2 22 - 32 mmol/L 24  19  18    Calcium  8.9 - 10.3 mg/dL 8.3  8.1  8.2      Author: Drue ONEIDA Potter, MD 12/16/2024 4:35 PM  For on call review [email or web address].  "

## 2024-12-16 NOTE — Consult Note (Cosign Needed)
 "   Urology Consult  I have been asked to see the patient by Dr. Cleatus, for evaluation and management of bladder outlet obstruction.  Chief Complaint: Weakness, hematochezia, lower abdominal/back pain  History of Present Illness: Albert Villarreal is a 78 y.o. year old male with PMH CKD, PAD, GI bleed, nephrolithiasis, BPH, and elevated PSA who canceled biopsy with Dr. Chauncey in 2017 admitted on 12/14/2024 with hematochezia and acute on chronic renal failure with bladder outlet obstruction/urinary retention and concern for acute pyelonephritis.  CTAP without contrast on presentation notable for massive urinary retention, estimated volume 1760 mL, with mild bilateral hydronephrosis and moderate bilateral perinephric stranding.  No abdominal pelvic lymphadenopathy appreciated.  A Foley catheter was subsequently placed.  Admission labs notable for creatinine 18.5 (baseline unknown); white count 12.7; and UA with 21-50 RBC/hpf and rare bacteria.  He has been on antibiotics as below and white count is normalized, 10.1 this morning.  Creatinine downtrending, 6.46 this morning.  He has been worked up for GI bleeding since admission, with EGD yesterday and colonoscopy earlier this morning.  He reports no acute concerns.  Foley catheter in place draining clear, yellow urine..  Anti-infectives (From admission, onward)    Start     Dose/Rate Route Frequency Ordered Stop   12/15/24 0030  cefTRIAXone  (ROCEPHIN ) 1 g in sodium chloride  0.9 % 100 mL IVPB        1 g 200 mL/hr over 30 Minutes Intravenous Every 24 hours 12/15/24 0016          Past Medical History:  Diagnosis Date   Arthritis    Chronic kidney disease    Chronic kidney disease, stage 3 (HCC) 06/15/2015   Claudication of right lower extremity    HTN (hypertension) 06/15/2015   Hypertension    Kidney stones    PAD (peripheral artery disease) 08/24/2015    Past Surgical History:  Procedure Laterality Date   ESOPHAGOGASTRODUODENOSCOPY N/A  12/15/2024   Procedure: EGD (ESOPHAGOGASTRODUODENOSCOPY);  Surgeon: Jinny Carmine, MD;  Location: [facility] ENDOSCOPY;  Service: Endoscopy;  Laterality: N/A;   NO PAST SURGERIES      Home Medications:  Active Medications[1]  Allergies: Allergies[2]  Family History  Problem Relation Age of Onset   Hypertension Mother    Hypertension Father    Prostate cancer Neg Hx    Bladder Cancer Neg Hx    Kidney cancer Neg Hx    Kidney disease Neg Hx     Social History:  reports that he quit smoking about 11 years ago. His smoking use included cigarettes. He has never used smokeless tobacco. He reports that he does not drink alcohol and does not use drugs.  ROS: A complete review of systems was performed.  All systems are negative except for pertinent findings as noted.  Physical Exam:  Vital signs in last 24 hours: Temp:  [96.6 F (35.9 C)-99.2 F (37.3 C)] 96.6 F (35.9 C) (12/19 0928) Pulse Rate:  [71-112] 71 (12/19 0949) Resp:  [13-27] 16 (12/19 0949) BP: (92-116)/(53-71) 95/60 (12/19 0949) SpO2:  [92 %-100 %] 100 % (12/19 0949) Constitutional:  Alert and oriented, no acute distress HEENT: [place] AT, moist mucus membranes Cardiovascular: No clubbing, cyanosis, or edema Respiratory: Normal respiratory effort Skin: No rashes, bruises or suspicious lesions Neurologic: Grossly intact, no focal deficits, moving all 4 extremities Psychiatric: Normal mood and affect  Laboratory Data:  Recent Labs    12/15/24 0107 12/15/24 0509 12/15/24 1859  WBC 12.1* 12.5* 10.1  HGB 8.5* 8.8*  10.4*  HCT 24.3* 25.4* 29.0*   Recent Labs    12/14/24 2333 12/15/24 0509 12/15/24 1859  NA 139 139 143  K 5.1 5.2* 5.1  CL 103 103 108  CO2 18* 19* 24  GLUCOSE 63* 89 165*  BUN 126* 109* 77*  CREATININE 16.20* 12.30* 6.46*  CALCIUM  8.2* 8.1* 8.3*   Urinalysis    Component Value Date/Time   COLORURINE YELLOW (A) 12/15/2024 0050   APPEARANCEUR CLEAR (A) 12/15/2024 0050   APPEARANCEUR Clear 09/13/2015  1007   LABSPEC 1.010 12/15/2024 0050   LABSPEC 1.017 01/13/2014 0508   PHURINE 6.0 12/15/2024 0050   GLUCOSEU NEGATIVE 12/15/2024 0050   GLUCOSEU Negative 01/13/2014 0508   HGBUR MODERATE (A) 12/15/2024 0050   BILIRUBINUR NEGATIVE 12/15/2024 0050   BILIRUBINUR Negative 09/13/2015 1007   BILIRUBINUR Negative 01/13/2014 0508   KETONESUR NEGATIVE 12/15/2024 0050   PROTEINUR NEGATIVE 12/15/2024 0050   NITRITE NEGATIVE 12/15/2024 0050   LEUKOCYTESUR NEGATIVE 12/15/2024 0050   LEUKOCYTESUR Trace 01/13/2014 0508   Results for orders placed or performed in visit on 09/13/15  Microscopic Examination     Status: Abnormal   Collection Time: 09/13/15 10:07 AM   URINE  Result Value Ref Range Status   WBC, UA 0-5 0 - 5 /hpf Final   RBC, UA None seen 0 - 2 /hpf Final   Epithelial Cells (non renal) None seen 0 - 10 /hpf Final   Mucus, UA Present (A) Not Estab. Final   Bacteria, UA None seen None seen/Few Final    Radiologic Imaging: NM GI Blood Loss Result Date: 12/15/2024 EXAM: NM GI Bleeding Scan. CLINICAL HISTORY: 838436 Lower GI bleed 838436 Lower GI bleed TECHNIQUE: Dynamic anterior images of the abdomen and pelvis were obtained during the time of radio-labeled autologous red blood cells. Images were acquired for 2 hours. RADIOPHARMACEUTICAL: 21.31 millicurie (Technetium-labeled red blood cells (ULTRATAG ) injection kit 21.31 millicurie TECHNETIUM TC 99M -LABELED RED BLOOD CELLS IV KIT). COMPARISON: CT 12/14/2024. FINDINGS: STOMACH AND BOWEL: No tagged red blood cell activity to localize active bleeding into the gastrointestinal tract. No evidence of active gastrointestinal bleeding. OTHER VISCERA: Physiologic activity in the liver, vasculature, and bladder noted. IMPRESSION: 1. No evidence of active gastrointestinal bleeding. Electronically signed by: Norleen Boxer MD 12/15/2024 02:36 PM EST RP Workstation: [id number]   CT ABDOMEN PELVIS WO CONTRAST Result Date: 12/14/2024 EXAM: CT ABDOMEN AND  PELVIS WITHOUT CONTRAST 12/14/2024 08:36:17 PM TECHNIQUE: CT of the abdomen and pelvis was performed without the administration of intravenous contrast. Multiplanar reformatted images are provided for review. Automated exposure control, iterative reconstruction, and/or weight-based adjustment of the mA/kV was utilized to reduce the radiation dose to as low as reasonably achievable. COMPARISON: None available. CLINICAL HISTORY: abd pain FINDINGS: LOWER CHEST: Round and subpleural 2.2 x 1.1 cm subpleural pulmonary mass is seen within the visualized right lung base demonstrating speckled internal calcifications which may represent sequelae of prior inflammation or a mass such as a pulmonary hematoma, but is indeterminate. Comparison with prior examination, if available, will be helpful in determining the stability. If none are available, dedicated non-emergent CT imaging is recommended in 1 month to assess for interval change and establish a baseline for continued surveillance. Extensive coronary artery calcifications. LIVER: The liver is unremarkable. GALLBLADDER AND BILE DUCTS: Gallbladder is unremarkable. No biliary ductal dilatation. SPLEEN: No acute abnormality. PANCREAS: No acute abnormality. ADRENAL GLANDS: No acute abnormality. KIDNEYS, URETERS AND BLADDER: There is marked prostatic hypertrophy noted. There is marked distention of the bladder  which measures 11.8 x 9.4 x 15.8 cm. Volume is 1760 mL. 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 There is mild bilateral hydronephrosis, likely the result of bladder outlet obstruction. No superimposed intrarenal or ureteral calculi. There is moderate bilateral superimposed perinephric inflammatory stranding, right greater than left, which may reflect a superimposed inflammatory process, most commonly ascending urinary tract infection. Correlation with urinalysis and urine culture  may be helpful for further management. No perinephric fluid collections are seen. GI AND BOWEL: Moderate descending and sigmoid colonic diverticulosis. The stomach, small bowel, and large bowel are otherwise unremarkable. Appendix is normal. There is no bowel obstruction. PERITONEUM AND RETROPERITONEUM: Sinus fat containing umbilical hernia. No ascites. No free air. VASCULATURE: Extensive aortoiliac atherosclerotic calcification. Aorta is normal in caliber. LYMPH NODES: No lymphadenopathy. REPRODUCTIVE ORGANS: No acute abnormality. BONES AND SOFT TISSUES: Benign bone island within the left ileum. Osseous structures are age appropriate. No acute bone abnormality. No lytic or blastic bone lesion. No focal soft tissue abnormality. RAF SCORE: Aortic atherosclerosis (ICD-10: I70.0) IMPRESSION: 1. Indeterminate 2.2 x 1.1 cm subpleural pulmonary mass with speckled internal calcifications in the right lung base; dedicated non-emergent chest CT in 1 month recommended to assess for interval change and establish a baseline if no prior imaging is available. 2. marked distention of the bladder with an estimated volume of 1760 ml with mild bilateral hydronephrosis, most likely secondary to bladder outlet obstruction. 3. Moderate bilateral perinephric inflammatory stranding, possibly related to superimposed ascending urinary tract infection. Correlation with urinalysis and urine culture may be helpful for further management. 4. Extensive coronary artery calcifications. Marked distention of the bladder measuring 11.8 x 9.4 x 15.8 cm with mild bilateral hydronephrosis, most likely secondary to bladder outlet obstruction. Moderate bilateral perinephric inflammatory stranding, greater on the right, which may reflect superimposed ascending urinary tract infection; correlation with urinalysis and urine culture may be helpful for further management. No intrarenal or ureteral calculi identified. Moderate descending and sigmoid colonic  diverticulosis without imaging evidence of diverticulitis. Extensive aortoiliac atherosclerotic calcification. Extensive coronary artery calcifications. Small left inferolateral congenital bladder diverticulum. Sinus fat-containing umbilical hernia. Raf score: Aortic atherosclerosis (icd10-i70.0). Electronically signed by: Dorethia Molt MD 12/14/2024 08:50 PM EST RP Workstation: [id number]   DG Chest 1 View Result Date: 12/14/2024 EXAM: 1 VIEW(S) XRAY OF THE CHEST 12/14/2024 07:53:24 PM COMPARISON: 01/12/2014 CLINICAL HISTORY: SOB. FINDINGS: LUNGS AND PLEURA: No focal pulmonary opacity. No pleural effusion. No pneumothorax. HEART AND MEDIASTINUM: No acute abnormality of the cardiac and mediastinal silhouettes. BONES AND SOFT TISSUES: No acute osseous abnormality. IMPRESSION: 1. No acute process. Electronically signed by: Morgane Naveau MD 12/14/2024 07:58 PM EST RP Workstation: [id number]   Assessment & Plan:  78 y.o. male with CKD, PAD, GI bleed, nephrolithiasis, BPH, and elevated PSA who canceled biopsy with Dr. Chauncey in 2017 admitted on 12/14/2024 with hematochezia and acute on chronic renal failure with bladder outlet obstruction/urinary retention and concern for acute pyelonephritis.  Renal function is improving with bladder decompression.  Admission UA was rather bland and no culture sent, altogether rather low suspicion for pyelonephritis.  Low suspicion for advanced prostate cancer with no lymphadenopathy appreciated on admission CT.  We discussed options including keeping Foley catheter with plans for outpatient voiding trial in 2 weeks versus proceeding to HOLEP with Dr. Francisca.  I was honest with him that based on his degree of massive urinary retention on arrival, I am not optimistic that he  would pass a voiding trial.  That said, he prefers to attempt a voiding trial before pursuing surgery, which is reasonable.  Recommendations: - Continue Foley catheter - Antibiotics per primary team,  overall low suspicion for pyelonephritis from our perspective - Would recommend deferring Flomax with his hypotension, concern for increasing fall risk due to orthostasis.  Okay to start finasteride. - Outpatient voiding trial in 2 weeks  Thank you for involving me in this patient's care, please page with any further questions or concerns.  Albert Breeze, PA-C 12/16/2024 12:18 PM      [1]  Current Meds  Medication Sig   lisinopril (PRINIVIL,ZESTRIL) 20 MG tablet Take 20 mg by mouth daily.   naproxen  (NAPROSYN ) 500 MG tablet TAKE ONE TABLET BY MOUTH TWICE DAILY WITH MEALS   rosuvastatin (CRESTOR) 10 MG tablet Take 10 mg by mouth daily.   tamsulosin (FLOMAX) 0.4 MG CAPS capsule Take 0.4 mg by mouth daily.  [2] No Known Allergies  "

## 2024-12-16 NOTE — Anesthesia Procedure Notes (Signed)
 Date/Time: 12/16/2024 8:41 AM  Performed by: Jaylene Nest, CRNAPre-anesthesia Checklist: Patient identified, Emergency Drugs available, Suction available, Patient being monitored and Timeout performed Patient Re-evaluated:Patient Re-evaluated prior to induction Oxygen Delivery Method: Simple face mask Preoxygenation: Pre-oxygenation with 100% oxygen Induction Type: IV induction

## 2024-12-16 NOTE — Transfer of Care (Signed)
 Immediate Anesthesia Transfer of Care Note  Patient: Albert Villarreal  Procedure(s) Performed: COLONOSCOPY  Patient Location: Endoscopy Unit  Anesthesia Type:General  Level of Consciousness: awake, alert , and drowsy  Airway & Oxygen Therapy: Patient Spontanous Breathing  Post-op Assessment: Report given to RN and Post -op Vital signs reviewed and stable  Post vital signs: Reviewed and stable  Last Vitals:  Vitals Value Taken Time  BP 111/68 0928  Temp 35.9 0928  Pulse 84 0928  Resp 16 0928  SpO2 96% 0928    Last Pain:  Vitals:   12/16/24 0812  TempSrc: Temporal  PainSc: 0-No pain         Complications: No notable events documented.

## 2024-12-16 NOTE — Anesthesia Postprocedure Evaluation (Signed)
"   Anesthesia Post Note  Patient: Kolden Dupee  Procedure(s) Performed: COLONOSCOPY  Patient location during evaluation: Endoscopy Anesthesia Type: General Level of consciousness: awake and alert Pain management: pain level controlled Vital Signs Assessment: post-procedure vital signs reviewed and stable Respiratory status: spontaneous breathing, nonlabored ventilation and respiratory function stable Cardiovascular status: blood pressure returned to baseline and stable Postop Assessment: no apparent nausea or vomiting Anesthetic complications: no   No notable events documented.   Last Vitals:  Vitals:   12/16/24 0940 12/16/24 0949  BP: (!) 104/55 95/60  Pulse: 82 71  Resp: 19 16  Temp:    SpO2: 100% 100%    Last Pain:  Vitals:   12/16/24 0949  TempSrc:   PainSc: Asleep                 Fairy POUR Syerra Abdelrahman      "

## 2024-12-16 NOTE — Plan of Care (Signed)

## 2024-12-16 NOTE — Care Management Important Message (Signed)
 Important Message  Patient Details  Name: Albert Villarreal MRN: [id number] Date of Birth: [birth year]   Important Message Given:  Yes - Medicare IM     Rojelio SHAUNNA Rattler 12/16/2024, 4:34 PM

## 2024-12-16 NOTE — TOC CM/SW Note (Signed)
 Transition of Care [facility]) - Inpatient Brief Assessment   Patient Details  Name: Albert Villarreal MRN: [id number] Date of Birth: [birth year]  Transition of Care [facility]) CM/SW Contact:    Shasta DELENA Daring, RN Phone Number: 12/16/2024, 3:37 PM   Clinical Narrative:   Transition of Care [facility]) Screening Note   Patient Details  Name: Albert Villarreal Date of Birth: 1946-02-26   Transition of Care [facility]) CM/SW Contact:    Shasta DELENA Daring, RN Phone Number: 12/16/2024, 3:37 PM    Transition of Care Department [facility]) has reviewed patient and no TOC needs have been identified at this time. We will continue to monitor patient advancement through interdisciplinary progression rounds. If new patient transition needs arise, please place a TOC consult.    Transition of Care Asessment: Insurance and Status: Insurance coverage has been reviewed Patient has primary care physician: Yes Home environment has been reviewed: single family home     Social Drivers of Health Review: SDOH reviewed no interventions necessary Readmission risk has been reviewed: No Transition of care needs: no transition of care needs at this time

## 2024-12-16 NOTE — Op Note (Signed)
 [facility] Patient Name: Albert Villarreal Procedure Date: 12/16/2024 8:38 AM MRN: [id number] Account #: [id number] Date of Birth: 04/01/1946 Admit Type: Inpatient Age: 78 Room: [facility] ENDO ROOM 2 Gender: Male Note Status: Finalized Instrument Name: Colon Scope [id number] Procedure:             Colonoscopy Indications:           Hematochezia Providers:             Rogelia Copping MD, MD Referring MD:          Curtistine IVAR Marina (Referring MD) Medicines:             Propofol  per Anesthesia Complications:         No immediate complications. Procedure:             Pre-Anesthesia Assessment:                        - Prior to the procedure, a History and Physical was                         performed, and patient medications and allergies were                         reviewed. The patient's tolerance of previous                         anesthesia was also reviewed. The risks and benefits                         of the procedure and the sedation options and risks                         were discussed with the patient. All questions were                         answered, and informed consent was obtained. Prior                         Anticoagulants: The patient has taken no anticoagulant                         or antiplatelet agents. ASA Grade Assessment: II - A                         patient with mild systemic disease. After reviewing                         the risks and benefits, the patient was deemed in                         satisfactory condition to undergo the procedure.                        After obtaining informed consent, the colonoscope was                         passed under direct vision. Throughout the procedure,  the patient's blood pressure, pulse, and oxygen                         saturations were monitored continuously. The                         Colonoscope was introduced through the anus and                          advanced to the the terminal ileum. The colonoscopy                         was performed without difficulty. The patient                         tolerated the procedure well. The quality of the bowel                         preparation was adequate to identify polyps. Findings:      The perianal and digital rectal examinations were normal.      The terminal ileum appeared normal. No blood in the ileum seen.      Hematin (altered blood/coffee-ground-like material) was found in the       rectum, in the recto-sigmoid colon, in the sigmoid colon, in the       descending colon, at the splenic flexure and in the transverse colon.      Multiple small-mouthed diverticula were found in the sigmoid colon. Impression:            - The examined portion of the ileum was normal.                        - Blood in the rectum, in the recto-sigmoid colon, in                         the sigmoid colon, in the descending colon, at the                         splenic flexure and in the transverse colon.                        - Diverticulosis in the sigmoid colon.                        - No specimens collected.                        - More blood on the left then the right and not seen                         in the terminal ileum.                        This is most consistent with a diverticular bleed. Recommendation:        - Return patient to hospital ward for ongoing care.                        - Resume previous diet.                        -  Continue present medications. Procedure Code(s):     --- Professional ---                        [phone], Colonoscopy, flexible; diagnostic, including                         collection of specimen(s) by brushing or washing, when                         performed (separate procedure) Diagnosis Code(s):     --- Professional ---                        K92.1, Melena (includes Hematochezia)                        K57.30, Diverticulosis of large intestine without                          perforation or abscess without bleeding CPT copyright 2022 [employer]. All rights reserved. The codes documented in this report are preliminary and upon coder review may  be revised to meet current compliance requirements. Rogelia Copping MD, MD 12/16/2024 9:25:52 AM This report has been signed electronically. Number of Addenda: 0 Note Initiated On: 12/16/2024 8:38 AM Scope Withdrawal Time: 0 hours 17 minutes 23 seconds  Total Procedure Duration: 0 hours 25 minutes 35 seconds  Estimated Blood Loss:  Estimated blood loss: none.      [facility]

## 2024-12-16 NOTE — Anesthesia Preprocedure Evaluation (Signed)
"                                    Anesthesia Evaluation  Patient identified by MRN, date of birth, ID band Patient awake    Reviewed: Allergy & Precautions, NPO status , Patient's Chart, lab work & pertinent test results  History of Anesthesia Complications Negative for: history of anesthetic complications  Airway Mallampati: III  TM Distance: >3 FB Neck ROM: full    Dental  (+) Chipped, Poor Dentition, Missing   Pulmonary neg shortness of breath, former smoker   Pulmonary exam normal        Cardiovascular hypertension, (-) angina + Peripheral Vascular Disease  Normal cardiovascular exam     Neuro/Psych  Neuromuscular disease  negative psych ROS   GI/Hepatic negative GI ROS, Neg liver ROS,neg GERD  ,,  Endo/Other  negative endocrine ROS    Renal/GU ARF and CRFRenal disease  negative genitourinary   Musculoskeletal   Abdominal   Peds  Hematology  (+) Blood dyscrasia, anemia   Anesthesia Other Findings Patient is NPO appropriate and reports no nausea or vomiting today.  Past Medical History: No date: Arthritis No date: Chronic kidney disease 06/15/2015: Chronic kidney disease, stage 3 (HCC) No date: Claudication of right lower extremity 06/15/2015: HTN (hypertension) No date: Hypertension No date: Kidney stones 08/24/2015: PAD (peripheral artery disease)  Past Surgical History: No date: NO PAST SURGERIES  BMI    Body Mass Index: 23.78 kg/m      Reproductive/Obstetrics negative OB ROS                              Anesthesia Physical Anesthesia Plan  ASA: 3  Anesthesia Plan: General   Post-op Pain Management:    Induction: Intravenous  PONV Risk Score and Plan: Propofol  infusion and TIVA  Airway Management Planned: Natural Airway and Nasal Cannula  Additional Equipment:   Intra-op Plan:   Post-operative Plan:   Informed Consent: I have reviewed the patients History and Physical, chart, labs and  discussed the procedure including the risks, benefits and alternatives for the proposed anesthesia with the patient or authorized representative who has indicated his/her understanding and acceptance.     Dental Advisory Given  Plan Discussed with: Anesthesiologist, CRNA and Surgeon  Anesthesia Plan Comments: (Patient and family member consented for risks of anesthesia including but not limited to:  - adverse reactions to medications - risk of airway placement if required - damage to eyes, teeth, lips or other oral mucosa - nerve damage due to positioning  - sore throat or hoarseness - Damage to heart, brain, nerves, lungs, other parts of body or loss of life  They voiced understanding and assent.)         Anesthesia Quick Evaluation  "

## 2024-12-17 DIAGNOSIS — N179 Acute kidney failure, unspecified: Secondary | ICD-10-CM

## 2024-12-17 LAB — CBC WITH DIFFERENTIAL/PLATELET
Abs Immature Granulocytes: 0.04 K/uL (ref 0.00–0.07)
Basophils Absolute: 0.1 K/uL (ref 0.0–0.1)
Basophils Relative: 1 %
Eosinophils Absolute: 0.5 K/uL (ref 0.0–0.5)
Eosinophils Relative: 5 %
HCT: 32.3 % — ABNORMAL LOW (ref 39.0–52.0)
Hemoglobin: 10.9 g/dL — ABNORMAL LOW (ref 13.0–17.0)
Immature Granulocytes: 0 %
Lymphocytes Relative: 27 %
Lymphs Abs: 2.9 K/uL (ref 0.7–4.0)
MCH: 31.8 pg (ref 26.0–34.0)
MCHC: 33.7 g/dL (ref 30.0–36.0)
MCV: 94.2 fL (ref 80.0–100.0)
Monocytes Absolute: 1.1 K/uL — ABNORMAL HIGH (ref 0.1–1.0)
Monocytes Relative: 10 %
Neutro Abs: 6.1 K/uL (ref 1.7–7.7)
Neutrophils Relative %: 57 %
Platelets: 254 K/uL (ref 150–400)
RBC: 3.43 MIL/uL — ABNORMAL LOW (ref 4.22–5.81)
RDW: 14.3 % (ref 11.5–15.5)
WBC: 10.7 K/uL — ABNORMAL HIGH (ref 4.0–10.5)
nRBC: 0 % (ref 0.0–0.2)

## 2024-12-17 LAB — BASIC METABOLIC PANEL WITH GFR
Anion gap: 10 (ref 5–15)
BUN: 33 mg/dL — ABNORMAL HIGH (ref 8–23)
CO2: 24 mmol/L (ref 22–32)
Calcium: 8.8 mg/dL — ABNORMAL LOW (ref 8.9–10.3)
Chloride: 111 mmol/L (ref 98–111)
Creatinine, Ser: 2.25 mg/dL — ABNORMAL HIGH (ref 0.61–1.24)
GFR, Estimated: 29 mL/min — ABNORMAL LOW
Glucose, Bld: 82 mg/dL (ref 70–99)
Potassium: 5.4 mmol/L — ABNORMAL HIGH (ref 3.5–5.1)
Sodium: 145 mmol/L (ref 135–145)

## 2024-12-17 LAB — RENAL FUNCTION PANEL
Albumin: 3.4 g/dL — ABNORMAL LOW (ref 3.5–5.0)
Anion gap: 11 (ref 5–15)
BUN: 34 mg/dL — ABNORMAL HIGH (ref 8–23)
CO2: 25 mmol/L (ref 22–32)
Calcium: 8.9 mg/dL (ref 8.9–10.3)
Chloride: 112 mmol/L — ABNORMAL HIGH (ref 98–111)
Creatinine, Ser: 2.24 mg/dL — ABNORMAL HIGH (ref 0.61–1.24)
GFR, Estimated: 29 mL/min — ABNORMAL LOW
Glucose, Bld: 90 mg/dL (ref 70–99)
Phosphorus: 2.9 mg/dL (ref 2.5–4.6)
Potassium: 5.5 mmol/L — ABNORMAL HIGH (ref 3.5–5.1)
Sodium: 148 mmol/L — ABNORMAL HIGH (ref 135–145)

## 2024-12-17 MED ORDER — SODIUM ZIRCONIUM CYCLOSILICATE 10 G PO PACK
10.0000 g | PACK | Freq: Once | ORAL | Status: AC
  Administered 2024-12-17: 10 g via ORAL
  Filled 2024-12-17 (×2): qty 1

## 2024-12-17 NOTE — Progress Notes (Signed)
 " Progress Note   Patient: Albert Villarreal FMW:[id number] DOB: Apr 11, 1946 DOA: 12/14/2024     3 DOS: the patient was seen and examined on 12/17/2024   Brief hospital course: From HPI Albert Villarreal is a 78 y.o. male with medical history significant for HTN, CKD 3, PAD, being admitted with hematochezia, acute on chronic renal failure with metabolic acidosis /bladder outlet obstruction.  Chart review reveals a history in 2014 of hospitalization for hematemesis/severe thrombocytopenia(Plt 3000)/AKI(Cr 4) in the setting of NSAID use(gastritis on EGD 2014) . 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  Today he presents with a 10-day history of blood in his stool associated with abdominal pain and generalized weakness.  He denies blood in the urine or dysuria.  Has a history of elevated BPH in 2021 but did not follow through for recommended prostate biopsy. In the ED, he was hypotensive at 87/58 with pulse in the 90s, afebrile.  Intermittently hypoxic to 88% on room air for which she was placed on O2 at 2 L.   Blood work with significant abnormalities including creatinine 18 with bicarb 14 and anion gap of 26 and potassium 6.5 Hemoglobin 12.9 with no recent baseline, platelets normal at 267 WBC 12.7, no lactic acid done No UA performed EKG showed sinus at 95 with peaked T waves CT abdomen and pelvis showed marked bladder distention with mild bilateral hydronephrosis secondary to bladder outlet obstruction, moderate bilateral perinephric stranding, extensive atherosclerosis including coronary artery calcification and an indeterminate subpleural pulmonary mass.-Please see report for details Patient treated with NS boluses, Lokelma , Veltassa , insulin  glucose and calcium  gluconate Admission requested.       Assessment and Plan: Acute renal failure superimposed on stage 3a chronic kidney disease (HCC) High anion gap metabolic acidosis Obstructive uropathy Bladder outlet obstruction with mild  bilateral hydronephrosis History of elevated PSA Etiology suspect renal and post renal Continue Foley decompression Monitor renal function and avoid nephrotoxins Case discussed with urology     Hyperkalemia On presentation patient had potassium 6.5 Potassium improved Continue management and monitoring   Hematochezia Acute blood loss anemia History of hematemesis (gastritis on EGD, 2014) Hemoglobin 12.9 with no recent baseline--->8.5 Bleeding scan did not show any active bleeding Patient underwent EGD by GI today that showed gastritis, duodenitis and nonbleeding duodenal diverticulum Underwent colonoscopy by GI today   Acute pyelonephritis SIRS with hypotension Patient with hypotension, tachycardia, O2 sats 88, leukocytosis  CT abdomen and pelvis showing perinephric stranding Received 2 L IV fluids in the ED Continue IV Rocephin  Follow-up on urine cultures   Bladder outlet obstruction Bilateral hydronephrosis History of elevated PSA with recommendation for prostate biopsy Continue Foley decompression Urology consult   PAD (peripheral artery disease) Extensive atherosclerosis/coronary atherosclerosis on CT Previously on aspirin , atorvastatin  Patient will benefit from ischemic evaluation Consider resuming aspirin  and atorvastatin  during hospitalization   Pleural mass Subpleural mass with recommendation for follow-up CT in 1 month   HTN (hypertension) Holding antihypertensives due to hypotension   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  Constitutional:      General: He is not in acute distress. HENT:     Head: Normocephalic and atraumatic.  Cardiovascular:     Rate and Rhythm: Normal rate and regular rhythm.     Heart sounds: Normal heart sounds.  Pulmonary:     Effort: Pulmonary effort is normal.     Breath sounds: Normal breath sounds.  Abdominal:     Palpations: Abdomen is soft.     Tenderness: There is no abdominal tenderness.  Neurological:     Mental Status: Mental status is at baseline.   Data Reviewed:   Vitals:   12/16/24 2350 12/17/24 0352 12/17/24 0740 12/17/24 1315  BP: 104/67 106/67 103/64 130/67  Pulse: 79 90 73 70  Resp: 20 20 17 18   Temp: 99.1 F (37.3 C) 98.9 F (37.2 C) 98.7 F (37.1 C) 98.5 F (36.9 C)  TempSrc: Oral Oral    SpO2: 99% 99% 100% 100%  Weight:      Height:          Latest Ref Rng & Units 12/17/2024    3:54 AM 12/15/2024    6:59 PM 12/15/2024    5:09 AM  CBC  WBC 4.0 - 10.5 K/uL 10.7  10.1  12.5   Hemoglobin 13.0 - 17.0 g/dL 89.0  89.5  8.8   Hematocrit 39.0 - 52.0 % 32.3  29.0  25.4   Platelets 150 - 400 K/uL 254  201  211        Latest Ref Rng & Units 12/17/2024    3:54 AM 12/15/2024    6:59 PM 12/15/2024    5:09 AM  BMP  Glucose 70 - 99 mg/dL 70 - 99 mg/dL 90    82  834  89   BUN 8 - 23 mg/dL 8 - 23 mg/dL 34    33  77  890   Creatinine 0.61 - 1.24 mg/dL 9.38 - 8.75 mg/dL 7.75    7.74  3.53  87.69   Sodium 135 - 145 mmol/L 135 - 145 mmol/L 148    145  143  139   Potassium 3.5 - 5.1 mmol/L 3.5 - 5.1 mmol/L 5.5    5.4  5.1  5.2   Chloride 98 - 111 mmol/L 98 - 111 mmol/L 112    111  108  103   CO2 22 - 32 mmol/L 22 - 32 mmol/L 25    24  24  19    Calcium  8.9 - 10.3 mg/dL 8.9 - 89.6 mg/dL 8.9    8.8  8.3  8.1      Author: Drue ONEIDA Potter, MD 12/17/2024 4:06 PM  For on call review [email or web address].  "

## 2024-12-17 NOTE — Progress Notes (Signed)
 Spoke with pt's daughter. Answered questions about POC.

## 2024-12-17 NOTE — Progress Notes (Signed)
 " [facility]  PROGRESS NOTE   Subjective:   Patient seen at bedside.  Comfortable.  Urine output excellent.  Objective:  Vital signs: Blood pressure 130/67, pulse 70, temperature 98.5 F (36.9 C), resp. rate 18, height 5' 2 (1.575 m), weight 59 kg, SpO2 100%.  Intake/Output Summary (Last 24 hours) at 12/17/2024 1332 Last data filed at 12/17/2024 0952 Gross per 24 hour  Intake 1160 ml  Output 3000 ml  Net -1840 ml   Filed Weights   12/14/24 1901  Weight: 59 kg     Physical Exam: General:  No acute distress  Head:  Normocephalic, atraumatic. Moist oral mucosal membranes  Eyes:  Anicteric  Neck:  Supple  Lungs:   Clear to auscultation, normal effort  Heart:  S1S2 no rubs  Abdomen:   Soft, nontender, bowel sounds present  Extremities:  peripheral edema.  Neurologic:  Awake, alert, following commands  Skin:  No lesions  Access:     Basic Metabolic Panel: Recent Labs  Lab 12/14/24 1918 12/14/24 2333 12/15/24 0509 12/15/24 1859 12/17/24 0354  NA 136 139 139 143 148*  145  K 6.5* 5.1 5.2* 5.1 5.5*  5.4*  CL 96* 103 103 108 112*  111  CO2 14* 18* 19* 24 25  24   GLUCOSE 144* 63* 89 165* 90  82  BUN 135* 126* 109* 77* 34*  33*  CREATININE 18.50* 16.20* 12.30* 6.46* 2.24*  2.25*  CALCIUM  8.4* 8.2* 8.1* 8.3* 8.9  8.8*  PHOS  --   --   --   --  2.9   GFR: Estimated Creatinine Clearance: 21.3 mL/min (A) (by C-G formula based on SCr of 2.24 mg/dL (H)).  Liver Function Tests: Recent Labs  Lab 12/14/24 1918 12/17/24 0354  AST 17  --   ALT 15  --   ALKPHOS 45  --   BILITOT 0.2  --   PROT 6.5  --   ALBUMIN 3.6 3.4*   No results for input(s): LIPASE, AMYLASE in the last 168 hours. No results for input(s): AMMONIA in the last 168 hours.  CBC: Recent Labs  Lab 12/14/24 1918 12/15/24 0107 12/15/24 0509 12/15/24 1859 12/17/24 0354  WBC 12.7* 12.1* 12.5* 10.1 10.7*  NEUTROABS  --   --   --   --  6.1  HGB 12.9* 8.5* 8.8* 10.4*  10.9*  HCT 35.9* 24.3* 25.4* 29.0* 32.3*  MCV 90.7 92.0 92.0 89.8 94.2  PLT 267 189 211 201 254     HbA1C: No results found for: HGBA1C  Urinalysis: Recent Labs    12/15/24 0050  COLORURINE YELLOW*  LABSPEC 1.010  PHURINE 6.0  GLUCOSEU NEGATIVE  HGBUR MODERATE*  BILIRUBINUR NEGATIVE  KETONESUR NEGATIVE  PROTEINUR NEGATIVE  NITRITE NEGATIVE  LEUKOCYTESUR NEGATIVE      Imaging: NM GI Blood Loss Result Date: 12/15/2024 EXAM: NM GI Bleeding Scan. CLINICAL HISTORY: 838436 Lower GI bleed 838436 Lower GI bleed TECHNIQUE: Dynamic anterior images of the abdomen and pelvis were obtained during the time of radio-labeled autologous red blood cells. Images were acquired for 2 hours. RADIOPHARMACEUTICAL: 21.31 millicurie (Technetium-labeled red blood cells (ULTRATAG ) injection kit 21.31 millicurie TECHNETIUM TC 99M -LABELED RED BLOOD CELLS IV KIT). COMPARISON: CT 12/14/2024. FINDINGS: STOMACH AND BOWEL: No tagged red blood cell activity to localize active bleeding into the gastrointestinal tract. No evidence of active gastrointestinal bleeding. OTHER VISCERA: Physiologic activity in the liver, vasculature, and bladder noted. IMPRESSION: 1. No evidence of active gastrointestinal bleeding. Electronically signed by: Norleen  Edmunds MD 12/15/2024 02:36 PM EST RP Workstation: [id number]     Medications:    cefTRIAXone  (ROCEPHIN )  IV 1 g (12/16/24 2123)    Chlorhexidine  Gluconate Cloth  6 each Topical Daily   pantoprazole  (PROTONIX ) IV  40 mg Intravenous Q12H   patiromer   25.2 g Oral Daily    Assessment/ Plan:     78 year old male with history of hypertension, nephrolithiasis, peripheral artery disease, osteoarthritis with history of nonsteroidal anti-inflammatory drug use, chronic kidney disease stage III now admitted with history of obstructive uropathy.  #1: Acute kidney injury: Acute kidney injury secondary to obstructive uropathy.  Foley draining urine and yellow color.  Renal indices  are stable.  #2: Hyperkalemia: Potassium has been stable.  Patient has been on Veltassa .  #3: Acute metabolic acidosis: Acidosis has improved significantly.  Labs and medications reviewed. Will continue to follow along with you.   LOS: 3 Pinkey Edman, MD [facility] 12/20/20251:32 PM  "

## 2024-12-18 DIAGNOSIS — N179 Acute kidney failure, unspecified: Secondary | ICD-10-CM | POA: Diagnosis not present

## 2024-12-18 LAB — TYPE AND SCREEN
ABO/RH(D): O POS
Antibody Screen: NEGATIVE
Unit division: 0
Unit division: 0
Unit division: 0

## 2024-12-18 LAB — BPAM RBC
Blood Product Expiration Date: 202601192359
Blood Product Expiration Date: 202601192359
Blood Product Expiration Date: 202601192359
ISSUE DATE / TIME: 202512180515
Unit Type and Rh: 5100
Unit Type and Rh: 5100
Unit Type and Rh: 5100

## 2024-12-18 LAB — CBC WITH DIFFERENTIAL/PLATELET
Abs Immature Granulocytes: 0.05 K/uL (ref 0.00–0.07)
Basophils Absolute: 0.1 K/uL (ref 0.0–0.1)
Basophils Relative: 1 %
Eosinophils Absolute: 0.4 K/uL (ref 0.0–0.5)
Eosinophils Relative: 4 %
HCT: 34.6 % — ABNORMAL LOW (ref 39.0–52.0)
Hemoglobin: 11.5 g/dL — ABNORMAL LOW (ref 13.0–17.0)
Immature Granulocytes: 1 %
Lymphocytes Relative: 32 %
Lymphs Abs: 3.2 K/uL (ref 0.7–4.0)
MCH: 31.8 pg (ref 26.0–34.0)
MCHC: 33.2 g/dL (ref 30.0–36.0)
MCV: 95.6 fL (ref 80.0–100.0)
Monocytes Absolute: 0.8 K/uL (ref 0.1–1.0)
Monocytes Relative: 8 %
Neutro Abs: 5.4 K/uL (ref 1.7–7.7)
Neutrophils Relative %: 54 %
Platelets: 283 K/uL (ref 150–400)
RBC: 3.62 MIL/uL — ABNORMAL LOW (ref 4.22–5.81)
RDW: 13.7 % (ref 11.5–15.5)
WBC: 10 K/uL (ref 4.0–10.5)
nRBC: 0 % (ref 0.0–0.2)

## 2024-12-18 LAB — PREPARE RBC (CROSSMATCH)

## 2024-12-18 LAB — BASIC METABOLIC PANEL WITH GFR
Anion gap: 11 (ref 5–15)
BUN: 22 mg/dL (ref 8–23)
CO2: 24 mmol/L (ref 22–32)
Calcium: 8.9 mg/dL (ref 8.9–10.3)
Chloride: 106 mmol/L (ref 98–111)
Creatinine, Ser: 1.71 mg/dL — ABNORMAL HIGH (ref 0.61–1.24)
GFR, Estimated: 41 mL/min — ABNORMAL LOW
Glucose, Bld: 76 mg/dL (ref 70–99)
Potassium: 4.8 mmol/L (ref 3.5–5.1)
Sodium: 142 mmol/L (ref 135–145)

## 2024-12-18 MED ORDER — PANTOPRAZOLE SODIUM 40 MG PO TBEC: 40.0000 mg | DELAYED_RELEASE_TABLET | Freq: Two times a day (BID) | ORAL | 3 refills | Status: AC

## 2024-12-18 NOTE — Progress Notes (Signed)
 " [facility]  PROGRESS NOTE   Subjective:   Seen at bedside.  Urine output has been excellent.  Feels much better at this time.  Objective:  Vital signs: Blood pressure 123/71, pulse 69, temperature 98.5 F (36.9 C), resp. rate 17, height 5' 2 (1.575 m), weight 59 kg, SpO2 100%.  Intake/Output Summary (Last 24 hours) at 12/18/2024 1424 Last data filed at 12/18/2024 0915 Gross per 24 hour  Intake 660 ml  Output 2200 ml  Net -1540 ml   Filed Weights   12/14/24 1901  Weight: 59 kg     Physical Exam: General:  No acute distress  Head:  Normocephalic, atraumatic. Moist oral mucosal membranes  Eyes:  Anicteric  Neck:  Supple  Lungs:   Clear to auscultation, normal effort  Heart:  S1S2 no rubs  Abdomen:   Soft, nontender, bowel sounds present  Extremities:  peripheral edema.  Neurologic:  Awake, alert, following commands  Skin:  No lesions  Access:     Basic Metabolic Panel: Recent Labs  Lab 12/14/24 2333 12/15/24 0509 12/15/24 1859 12/17/24 0354 12/18/24 0436  NA 139 139 143 148*  145 142  K 5.1 5.2* 5.1 5.5*  5.4* 4.8  CL 103 103 108 112*  111 106  CO2 18* 19* 24 25  24 24   GLUCOSE 63* 89 165* 90  82 76  BUN 126* 109* 77* 34*  33* 22  CREATININE 16.20* 12.30* 6.46* 2.24*  2.25* 1.71*  CALCIUM  8.2* 8.1* 8.3* 8.9  8.8* 8.9  PHOS  --   --   --  2.9  --    GFR: Estimated Creatinine Clearance: 27.9 mL/min (A) (by C-G formula based on SCr of 1.71 mg/dL (H)).  Liver Function Tests: Recent Labs  Lab 12/14/24 1918 12/17/24 0354  AST 17  --   ALT 15  --   ALKPHOS 45  --   BILITOT 0.2  --   PROT 6.5  --   ALBUMIN 3.6 3.4*   No results for input(s): LIPASE, AMYLASE in the last 168 hours. No results for input(s): AMMONIA in the last 168 hours.  CBC: Recent Labs  Lab 12/15/24 0107 12/15/24 0509 12/15/24 1859 12/17/24 0354 12/18/24 0436  WBC 12.1* 12.5* 10.1 10.7* 10.0  NEUTROABS  --   --   --  6.1 5.4  HGB 8.5* 8.8* 10.4*  10.9* 11.5*  HCT 24.3* 25.4* 29.0* 32.3* 34.6*  MCV 92.0 92.0 89.8 94.2 95.6  PLT 189 211 201 254 283     HbA1C: No results found for: HGBA1C  Urinalysis: No results for input(s): COLORURINE, LABSPEC, PHURINE, GLUCOSEU, HGBUR, BILIRUBINUR, KETONESUR, PROTEINUR, UROBILINOGEN, NITRITE, LEUKOCYTESUR in the last 72 hours.  Invalid input(s): APPERANCEUR    Imaging: No results found.   Medications:    cefTRIAXone  (ROCEPHIN )  IV 1 g (12/17/24 2129)    Chlorhexidine  Gluconate Cloth  6 each Topical Daily   pantoprazole  (PROTONIX ) IV  40 mg Intravenous Q12H   patiromer   25.2 g Oral Daily    Assessment/ Plan:     78 year old male with history of hypertension, nephrolithiasis, peripheral artery disease, osteoarthritis with history of nonsteroidal anti-inflammatory drug use, chronic kidney disease stage III now admitted with history of obstructive uropathy.   #1: Acute kidney injury: Acute kidney injury secondary to obstructive uropathy.  Foley draining urine and yellow color.  Renal indices are stable.   #2: Hyperkalemia: Potassium has been stable.  Patient has been on Veltassa .  May need to be  discontinued tomorrow.   #3: Acute metabolic acidosis: Acidosis has improved significantly.    Labs and medications reviewed. Will continue to follow along with you.   LOS: 4 Shauniece Kwan, MD [facility] 12/21/20252:24 PM  "

## 2024-12-18 NOTE — Discharge Summary (Signed)
 " Physician Discharge Summary   Patient: Albert Villarreal MRN: [id number] DOB: 1946/07/06  Admit date:     12/14/2024  Discharge date: 12/18/2024  Discharge Physician: Drue ONEIDA Potter   PCP: [name] ONEIDA, PA-C   Recommendations at discharge:  Follow-up with nephrology and GI  Discharge Diagnoses: Principal Problem:   Acute renal failure superimposed on stage 3a chronic kidney disease (HCC) Active Problems:   High anion gap metabolic acidosis   Hyperkalemia   Hematochezia   Anemia   ABLA (acute blood loss anemia)   Bladder outlet obstruction   Bilateral hydronephrosis   SIRS (systemic inflammatory response syndrome) (HCC)   Acute pyelonephritis   PAD (peripheral artery disease)   Atherosclerosis   Pleural mass   HTN (hypertension)   History of ITP   Melena   Diverticular disease of colon   Blood in stool   Acute GI bleeding  Resolved Problems:   * No resolved hospital problems. [facility] Hospital Course:  Brief hospital course: From HPI Albert Villarreal is a 78 y.o. male with medical history significant for HTN, CKD 3, PAD, being admitted with hematochezia, acute on chronic renal failure with metabolic acidosis /bladder outlet obstruction.  Chart review reveals a history in 2014 of hospitalization for hematemesis/severe thrombocytopenia(Plt 3000)/AKI(Cr 4) in the setting of NSAID use(gastritis on EGD 2014) . 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  Today he presents with a 10-day history of blood in his stool associated with abdominal pain and generalized weakness.  He denies blood in the urine or dysuria.  Has a history of elevated BPH in 2021 but did not follow through for recommended prostate biopsy. In the ED, he was hypotensive at 87/58 with pulse in the 90s, afebrile.  Intermittently hypoxic to 88% on room air for which she was placed on O2 at 2 L.   Blood work with significant abnormalities including creatinine 18 with bicarb 14 and anion gap of 26 and potassium  6.5 Hemoglobin 12.9 with no recent baseline, platelets normal at 267 WBC 12.7, no lactic acid done No UA performed EKG showed sinus at 95 with peaked T waves CT abdomen and pelvis showed marked bladder distention with mild bilateral hydronephrosis secondary to bladder outlet obstruction, moderate bilateral perinephric stranding, extensive atherosclerosis including coronary artery calcification and an indeterminate subpleural pulmonary mass.-Please see report for details Patient treated with NS boluses, Lokelma , Veltassa , insulin  glucose and calcium  gluconate Admission requested.       Assessment and Plan: Acute renal failure superimposed on stage 3a chronic kidney disease (HCC) High anion gap metabolic acidosis Obstructive uropathy Bladder outlet obstruction with mild bilateral hydronephrosis History of elevated PSA Etiology suspect renal and post renal Continue Foley decompression Monitor renal function and avoid nephrotoxins Case discussed with urology Patient was advised to avoid NSAIDs     Hyperkalemia On presentation patient had potassium 6.5 Potassium improved Continue management and monitoring   Hematochezia Acute blood loss anemia History of hematemesis (gastritis on EGD, 2014) Hemoglobin 12.9 with no recent baseline--->8.5 Bleeding scan did not show any active bleeding Patient underwent EGD by GI  that showed gastritis, duodenitis and nonbleeding duodenal diverticulum Underwent colonoscopy by GI   Acute pyelonephritis SIRS with hypotension Patient with hypotension, tachycardia, O2 sats 88, leukocytosis  CT abdomen and pelvis showing perinephric stranding Patient completed antibiotic therapy   Bladder outlet obstruction Bilateral hydronephrosis History of elevated PSA with recommendation for prostate biopsy Continue Foley decompression Urology consult   PAD (peripheral artery disease) Extensive atherosclerosis/coronary  atherosclerosis on CT Previously on  aspirin , atorvastatin  Patient will benefit from ischemic evaluation Continue statin therapy   Pleural mass Subpleural mass with recommendation for follow-up CT in 1 month   HTN (hypertension) Holding antihypertensives due to hypotension   History of ITP History of ITP 2014 with platelets as low as 3000 requiring platelet transfusion, treated with steroid Had bone marrow biopsy and briefly followed with oncology Platelet count currently normal    Consultants: GI, nephrology Procedures performed: As mentioned above Disposition: Home Diet recommendation:  Cardiac diet DISCHARGE MEDICATION: Allergies as of 12/18/2024   No Known Allergies      Medication List     STOP taking these medications    aspirin  EC 81 MG tablet   atorvastatin  20 MG tablet Commonly known as: LIPITOR   cilostazol  50 MG tablet Commonly known as: Pletal    lisinopril 20 MG tablet Commonly known as: ZESTRIL   methocarbamol  500 MG tablet Commonly known as: ROBAXIN    naproxen  500 MG tablet Commonly known as: NAPROSYN        TAKE these medications    pantoprazole  40 MG tablet Commonly known as: Protonix  Take 1 tablet (40 mg total) by mouth 2 (two) times daily.   rosuvastatin 10 MG tablet Commonly known as: CRESTOR Take 10 mg by mouth daily.   tamsulosin 0.4 MG Caps capsule Commonly known as: FLOMAX Take 0.4 mg by mouth daily.        Follow-up Information     Lateef, Munsoor, MD Follow up.   Specialty: Nephrology Contact information: [address] [place] [address] [phone]         Jinny Carmine, MD Follow up.   Specialty: Gastroenterology Contact information: [address] [place]  [place] [address] [phone]                Discharge Exam: [name]   12/14/24 1901  Weight: 59 kg   Vitals and nursing note reviewed.  Constitutional:      General: He is not in acute distress. HENT:     Head: Normocephalic and atraumatic.  Cardiovascular:      Rate and Rhythm: Normal rate and regular rhythm.     Heart sounds: Normal heart sounds.  Pulmonary:     Effort: Pulmonary effort is normal.     Breath sounds: Normal breath sounds.  Abdominal:     Palpations: Abdomen is soft.     Tenderness: There is no abdominal tenderness.  Neurological:     Mental Status: Mental status is at baseline.   Condition at discharge: good  The results of significant diagnostics from this hospitalization (including imaging, microbiology, ancillary and laboratory) are listed below for reference.   Imaging Studies: NM GI Blood Loss Result Date: 12/15/2024 EXAM: NM GI Bleeding Scan. CLINICAL HISTORY: 838436 Lower GI bleed 838436 Lower GI bleed TECHNIQUE: Dynamic anterior images of the abdomen and pelvis were obtained during the time of radio-labeled autologous red blood cells. Images were acquired for 2 hours. RADIOPHARMACEUTICAL: 21.31 millicurie (Technetium-labeled red blood cells (ULTRATAG ) injection kit 21.31 millicurie TECHNETIUM TC 99M -LABELED RED BLOOD CELLS IV KIT). COMPARISON: CT 12/14/2024. FINDINGS: STOMACH AND BOWEL: No tagged red blood cell activity to localize active bleeding into the gastrointestinal tract. No evidence of active gastrointestinal bleeding. OTHER VISCERA: Physiologic activity in the liver, vasculature, and bladder noted. IMPRESSION: 1. No evidence of active gastrointestinal bleeding. Electronically signed by: Norleen Boxer MD 12/15/2024 02:36 PM EST RP Workstation: [id number]   CT ABDOMEN PELVIS WO CONTRAST Result Date:  12/14/2024 EXAM: CT ABDOMEN AND PELVIS WITHOUT CONTRAST 12/14/2024 08:36:17 PM TECHNIQUE: CT of the abdomen and pelvis was performed without the administration of intravenous contrast. Multiplanar reformatted images are provided for review. Automated exposure control, iterative reconstruction, and/or weight-based adjustment of the mA/kV was utilized to reduce the radiation dose to as low as reasonably achievable. COMPARISON:  None available. CLINICAL HISTORY: abd pain FINDINGS: LOWER CHEST: Round and subpleural 2.2 x 1.1 cm subpleural pulmonary mass is seen within the visualized right lung base demonstrating speckled internal calcifications which may represent sequelae of prior inflammation or a mass such as a pulmonary hematoma, but is indeterminate. Comparison with prior examination, if available, will be helpful in determining the stability. If none are available, dedicated non-emergent CT imaging is recommended in 1 month to assess for interval change and establish a baseline for continued surveillance. Extensive coronary artery calcifications. LIVER: The liver is unremarkable. GALLBLADDER AND BILE DUCTS: Gallbladder is unremarkable. No biliary ductal dilatation. SPLEEN: No acute abnormality. PANCREAS: No acute abnormality. ADRENAL GLANDS: No acute abnormality. KIDNEYS, URETERS AND BLADDER: There is marked prostatic hypertrophy noted. There is marked distention of the bladder which measures 11.8 x 9.4 x 15.8 cm. Volume is 1760 mL. 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 There is mild bilateral hydronephrosis, likely the result of bladder outlet obstruction. No superimposed intrarenal or ureteral calculi. There is moderate bilateral superimposed perinephric inflammatory stranding, right greater than left, which may reflect a superimposed inflammatory process, most commonly ascending urinary tract infection. Correlation with urinalysis and urine culture may be helpful for further management. No perinephric fluid collections are seen. GI AND BOWEL: Moderate descending and sigmoid colonic diverticulosis. The stomach, small bowel, and large bowel are otherwise unremarkable. Appendix is normal. There is no bowel obstruction. PERITONEUM AND RETROPERITONEUM: Sinus fat containing umbilical hernia. No ascites. No free air. VASCULATURE: Extensive  aortoiliac atherosclerotic calcification. Aorta is normal in caliber. LYMPH NODES: No lymphadenopathy. REPRODUCTIVE ORGANS: No acute abnormality. BONES AND SOFT TISSUES: Benign bone island within the left ileum. Osseous structures are age appropriate. No acute bone abnormality. No lytic or blastic bone lesion. No focal soft tissue abnormality. RAF SCORE: Aortic atherosclerosis (ICD-10: I70.0) IMPRESSION: 1. Indeterminate 2.2 x 1.1 cm subpleural pulmonary mass with speckled internal calcifications in the right lung base; dedicated non-emergent chest CT in 1 month recommended to assess for interval change and establish a baseline if no prior imaging is available. 2. marked distention of the bladder with an estimated volume of 1760 ml with mild bilateral hydronephrosis, most likely secondary to bladder outlet obstruction. 3. Moderate bilateral perinephric inflammatory stranding, possibly related to superimposed ascending urinary tract infection. Correlation with urinalysis and urine culture may be helpful for further management. 4. Extensive coronary artery calcifications. Marked distention of the bladder measuring 11.8 x 9.4 x 15.8 cm with mild bilateral hydronephrosis, most likely secondary to bladder outlet obstruction. Moderate bilateral perinephric inflammatory stranding, greater on the right, which may reflect superimposed ascending urinary tract infection; correlation with urinalysis and urine culture may be helpful for further management. No intrarenal or ureteral calculi identified. Moderate descending and sigmoid colonic diverticulosis without imaging evidence of diverticulitis. Extensive aortoiliac atherosclerotic calcification. Extensive coronary artery calcifications. Small left inferolateral congenital bladder diverticulum. Sinus fat-containing umbilical hernia. Raf score: Aortic atherosclerosis (icd10-i70.0). Electronically signed by: Dorethia Molt MD 12/14/2024 08:50 PM EST RP Workstation: [id number]    DG Chest 1 View Result Date: 12/14/2024 EXAM: 1 VIEW(S)  XRAY OF THE CHEST 12/14/2024 07:53:24 PM COMPARISON: 01/12/2014 CLINICAL HISTORY: SOB. FINDINGS: LUNGS AND PLEURA: No focal pulmonary opacity. No pleural effusion. No pneumothorax. HEART AND MEDIASTINUM: No acute abnormality of the cardiac and mediastinal silhouettes. BONES AND SOFT TISSUES: No acute osseous abnormality. IMPRESSION: 1. No acute process. Electronically signed by: Morgane Naveau MD 12/14/2024 07:58 PM EST RP Workstation: [id number]    Microbiology: Results for orders placed or performed in visit on 09/13/15  Microscopic Examination     Status: Abnormal   Collection Time: 09/13/15 10:07 AM   URINE  Result Value Ref Range Status   WBC, UA 0-5 0 - 5 /hpf Final   RBC, UA None seen 0 - 2 /hpf Final   Epithelial Cells (non renal) None seen 0 - 10 /hpf Final   Mucus, UA Present (A) Not Estab. Final   Bacteria, UA None seen None seen/Few Final    Labs: CBC: Recent Labs  Lab 12/15/24 0107 12/15/24 0509 12/15/24 1859 12/17/24 0354 12/18/24 0436  WBC 12.1* 12.5* 10.1 10.7* 10.0  NEUTROABS  --   --   --  6.1 5.4  HGB 8.5* 8.8* 10.4* 10.9* 11.5*  HCT 24.3* 25.4* 29.0* 32.3* 34.6*  MCV 92.0 92.0 89.8 94.2 95.6  PLT 189 211 201 254 283   Basic Metabolic Panel: Recent Labs  Lab 12/14/24 2333 12/15/24 0509 12/15/24 1859 12/17/24 0354 12/18/24 0436  NA 139 139 143 148*  145 142  K 5.1 5.2* 5.1 5.5*  5.4* 4.8  CL 103 103 108 112*  111 106  CO2 18* 19* 24 25  24 24   GLUCOSE 63* 89 165* 90  82 76  BUN 126* 109* 77* 34*  33* 22  CREATININE 16.20* 12.30* 6.46* 2.24*  2.25* 1.71*  CALCIUM  8.2* 8.1* 8.3* 8.9  8.8* 8.9  PHOS  --   --   --  2.9  --    Liver Function Tests: Recent Labs  Lab 12/14/24 1918 12/17/24 0354  AST 17  --   ALT 15  --   ALKPHOS 45  --   BILITOT 0.2  --   PROT 6.5  --   ALBUMIN 3.6 3.4*   CBG: Recent Labs  Lab 12/14/24 2102 12/15/24 0442 12/15/24 0554  GLUCAP 116* 61*  86    Discharge time spent:  36 minutes.  Signed: Drue ONEIDA Potter, MD [facility] 12/18/2024 "

## 2024-12-31 NOTE — Anesthesia Postprocedure Evaluation (Signed)
"   Anesthesia Post Note  Patient: Albert Villarreal  Procedure(s) Performed: EGD (ESOPHAGOGASTRODUODENOSCOPY)  Patient location during evaluation: PACU Anesthesia Type: General Level of consciousness: awake and alert Pain management: pain level controlled Vital Signs Assessment: post-procedure vital signs reviewed and stable Respiratory status: spontaneous breathing, nonlabored ventilation, respiratory function stable and patient connected to nasal cannula oxygen Cardiovascular status: blood pressure returned to baseline and stable Postop Assessment: no apparent nausea or vomiting Anesthetic complications: no   No notable events documented.   Last Vitals:  Vitals:   12/18/24 0746 12/18/24 1053  BP: 126/74 123/71  Pulse: 64 69  Resp: 17 17  Temp: 37 C 36.9 C  SpO2: 100% 100%    Last Pain:  Vitals:   12/18/24 0650  TempSrc: Oral  PainSc:                  Lynwood KANDICE Clause      "

## 2025-01-09 ENCOUNTER — Ambulatory Visit: Admitting: Physician Assistant

## 2025-01-09 ENCOUNTER — Encounter: Payer: Self-pay | Admitting: Physician Assistant

## 2025-01-09 VITALS — BP 118/76 | HR 66 | Ht 62.0 in | Wt 122.0 lb

## 2025-01-09 DIAGNOSIS — R339 Retention of urine, unspecified: Secondary | ICD-10-CM

## 2025-01-09 LAB — BLADDER SCAN AMB NON-IMAGING

## 2025-01-09 NOTE — Progress Notes (Signed)
 "  01/09/2025 9:59 AM   Albert Villarreal [birth year] [id number]  CC: Chief Complaint  Patient presents with   Other    HPI: Albert Villarreal is a 79 y.o. male with PMH CKD, PAD, GI bleed, nephrolithiasis, BPH, and elevated PSA who canceled biopsy with Dr. Chauncey in 2017 with a recent history of massive urinary retention in the setting of possible GI bleed and possible pyelonephritis who presents today for voiding trial.   Foley removed in the morning; see separate note.  He returned in the afternoon. He has not voided, and does not feel the urge to do so. PVR >267mL.  Notably, while admitted I recommended initiation of finasteride.  This was not done.  He is on Flomax and is tolerating it without orthostasis.  He is moving his bowels regularly but admits that he is not at baseline mobility since his discharge.  PMH: Past Medical History:  Diagnosis Date   Arthritis    Chronic kidney disease    Chronic kidney disease, stage 3 (HCC) 06/15/2015   Claudication of right lower extremity    HTN (hypertension) 06/15/2015   Hypertension    Kidney stones    PAD (peripheral artery disease) 08/24/2015    Surgical History: Past Surgical History:  Procedure Laterality Date   COLONOSCOPY N/A 12/16/2024   Procedure: COLONOSCOPY;  Surgeon: Jinny Carmine, MD;  Location: [facility] ENDOSCOPY;  Service: Endoscopy;  Laterality: N/A;   ESOPHAGOGASTRODUODENOSCOPY N/A 12/15/2024   Procedure: EGD (ESOPHAGOGASTRODUODENOSCOPY);  Surgeon: Jinny Carmine, MD;  Location: [facility] ENDOSCOPY;  Service: Endoscopy;  Laterality: N/A;   NO PAST SURGERIES      Home Medications:  Allergies as of 01/09/2025   No Known Allergies      Medication List        Accurate as of January 09, 2025  9:59 AM. If you have any questions, ask your nurse or doctor.          pantoprazole  40 MG tablet Commonly known as: Protonix  Take 1 tablet (40 mg total) by mouth 2 (two) times daily.   rosuvastatin 10 MG tablet Commonly known as:  CRESTOR Take 10 mg by mouth daily.   tamsulosin 0.4 MG Caps capsule Commonly known as: FLOMAX Take 0.4 mg by mouth daily.        Allergies:  Allergies[1]  Family History: Family History  Problem Relation Age of Onset   Hypertension Mother    Hypertension Father    Prostate cancer Neg Hx    Bladder Cancer Neg Hx    Kidney cancer Neg Hx    Kidney disease Neg Hx     Social History:   reports that he quit smoking about 12 years ago. His smoking use included cigarettes. He has never used smokeless tobacco. He reports that he does not drink alcohol and does not use drugs.  Physical Exam: There were no vitals taken for this visit.  Constitutional:  Alert and oriented, no acute distress, nontoxic appearing HEENT: [place], AT Cardiovascular: No clubbing, cyanosis, or edema Respiratory: Normal respiratory effort, no increased work of breathing Skin: No rashes, bruises or suspicious lesions Neurologic: Grossly intact, no focal deficits, moving all 4 extremities Psychiatric: Normal mood and affect  Laboratory Data: Results for orders placed or performed in visit on 01/09/25  BLADDER SCAN AMB NON-IMAGING   Collection Time: 01/09/25  3:20 PM  Result Value Ref Range   Scan Result >236ml    Assessment & Plan:   1. Urinary retention (Primary) Voiding trial equivocal.  I  offered him Foley catheter replacement versus continued trial without catheter with plans for repeat PVR later this week and he elected for the latter.  We will see him back in 2 days for repeat PVR, though we discussed that if he is unable to void by 9 AM tomorrow, he should come back to clinic for Foley placement.  They expressed understanding.  We had a lengthy conversation about treatment options at this point.  We again discussed HOLEP for management of his BPH.  If he fails this voiding trial, I will encourage him to pursue this.  Alternatively, could consider chronic Foley on finasteride with plans for repeat voiding  trial in 6 months, though we discussed there is no guarantee that this will be successful. - BLADDER SCAN AMB NON-IMAGING   Return in 2 days (on 01/11/2025) for Repeat PVR.  Lucie Hones, PA-C  [facility] [place] [address] [place], [place] [address] [phone]       [1] No Known Allergies  "

## 2025-01-09 NOTE — Progress Notes (Signed)
 Catheter Removal  Patient is present today for a catheter removal.  9ml of water  was drained from the balloon. A 16FR foley cath was removed from the bladder, no complications were noted. Patient tolerated well.  Performed by: Laymon Ned, CMA  Follow up/ Additional notes: Follow up 01/09/2025 at 3:00pm

## 2025-01-10 ENCOUNTER — Ambulatory Visit: Admitting: Urology

## 2025-01-10 ENCOUNTER — Other Ambulatory Visit: Payer: Self-pay

## 2025-01-10 ENCOUNTER — Encounter: Payer: Self-pay | Admitting: Urology

## 2025-01-10 VITALS — BP 135/87 | HR 69

## 2025-01-10 DIAGNOSIS — N138 Other obstructive and reflux uropathy: Secondary | ICD-10-CM | POA: Diagnosis not present

## 2025-01-10 DIAGNOSIS — N401 Enlarged prostate with lower urinary tract symptoms: Secondary | ICD-10-CM

## 2025-01-10 DIAGNOSIS — R339 Retention of urine, unspecified: Secondary | ICD-10-CM | POA: Diagnosis not present

## 2025-01-10 LAB — BLADDER SCAN AMB NON-IMAGING

## 2025-01-10 NOTE — Progress Notes (Signed)
 "    01/10/2025 1:52 PM   Albert Villarreal 1946-12-11 [id number]  Referring provider: [name] DASEN, PA-C [address] [place],  [place] [address]  Urological history: 1. Elevated PSA - PSA (2021) 6.3  - PSA (2017) 8.0   Chief Complaint  Patient presents with   Urinary Retention    HPI: Albert Villarreal is a 79 y.o. man who presents today for after failing a TOV with his grandson, Elspeth.   Previous records reviewed.  He was found to be in urinary retention back in December after presentation to the hospital Foley catheter was placed for an estimated volume of 1760 mL.  He was not started on Flomax secondary to his hypotension, but he was started on finasteride 5 mg daily.  He presented yesterday for a voiding trial had a Foley catheter removed in the morning.  When he returned in the afternoon his bladder scan noted greater than 200 in the bladder, but he had not urinated since the catheter has been removed.  Unfortunately, he went into retention overnight.  Foley catheter was replaced for 500 cc of yellow urine return.     PMH: Past Medical History:  Diagnosis Date   Arthritis    Chronic kidney disease    Chronic kidney disease, stage 3 (HCC) 06/15/2015   Claudication of right lower extremity    HTN (hypertension) 06/15/2015   Hypertension    Kidney stones    PAD (peripheral artery disease) 08/24/2015    Surgical History: Past Surgical History:  Procedure Laterality Date   COLONOSCOPY N/A 12/16/2024   Procedure: COLONOSCOPY;  Surgeon: Jinny Carmine, MD;  Location: [facility] ENDOSCOPY;  Service: Endoscopy;  Laterality: N/A;   ESOPHAGOGASTRODUODENOSCOPY N/A 12/15/2024   Procedure: EGD (ESOPHAGOGASTRODUODENOSCOPY);  Surgeon: Jinny Carmine, MD;  Location[facility] ENDOSCOPY;  Service: Endoscopy;  Laterality: N/A;   NO PAST SURGERIES      Home Medications:  Allergies as of 01/10/2025   No Known Allergies      Medication List        Accurate as of January 10, 2025   1:52 PM. If you have any questions, ask your nurse or doctor.          pantoprazole  40 MG tablet Commonly known as: Protonix  Take 1 tablet (40 mg total) by mouth 2 (two) times daily.   rosuvastatin 10 MG tablet Commonly known as: CRESTOR Take 10 mg by mouth daily.   tamsulosin 0.4 MG Caps capsule Commonly known as: FLOMAX Take 0.4 mg by mouth daily.        Allergies: Allergies[1]  Family History: Family History  Problem Relation Age of Onset   Hypertension Mother    Hypertension Father    Prostate cancer Neg Hx    Bladder Cancer Neg Hx    Kidney cancer Neg Hx    Kidney disease Neg Hx     Social History:  reports that he quit smoking about 12 years ago. His smoking use included cigarettes. He has never used smokeless tobacco. He reports that he does not drink alcohol and does not use drugs.  ROS: Pertinent ROS in HPI  Physical Exam: BP 135/87   Pulse 69   SpO2 98%   Constitutional:  Well nourished. Alert and oriented, No acute distress. HEENT: [place] AT, moist mucus membranes.  Trachea midline Cardiovascular: No clubbing, cyanosis, or edema. Respiratory: Normal respiratory effort, no increased work of breathing. Neurologic: Grossly intact, no focal deficits, moving all 4 extremities. Psychiatric: Normal mood and affect.  Laboratory Data: See Epic and HPI   I have reviewed the labs.   Pertinent Imaging:  01/10/25 09:35  Scan Result   Assessment & Plan:    1. Urinary retention (Primary) - Bladder Scan (Post Void Residual) in office - Foley catheter replaced today - he is interested in pursuing HoLEP at this time - orders place for HoLEP - will need to coordinate a return visit prior to the HoLEP procedure for pre-op urinalysis and urine culture along with catheter exchange   Return for Pre-op for HoLEP - Melissa has orders .  These notes generated with voice recognition software. I apologize for typographical errors.  CLOTILDA CORNWALL,  PA-C  [facility] [address]  [address] [place], [place] [address] [phone]   Surgical Physician Order Form [facility]  * Scheduling expectation : Next Available with Dr. Francisca   *Length of Case:   *Clearance needed: no  *Anticoagulation Instructions: Hold all anticoagulants; does not take any  *Aspirin  Instructions: Hold Aspirin ; does not take any   *Post-op visit Date/Instructions:  1-3 day cath removal  *Diagnosis: BPH with retention   *Procedure:  HOLEP (47350)   Additional orders: N/A  -Admit type: OUTpatient  -Anesthesia: General  -VTE Prophylaxis Standing Order SCDs       Other:   -Standing Lab Orders Per Anesthesia    Lab other: UA&Urine Culture  -Standing Test orders EKG/Chest x-ray per Anesthesia       Test other:   - Medications:  Ancef 2gm IV  -Other orders:  N/A           [1] No Known Allergies  "

## 2025-01-10 NOTE — Progress Notes (Signed)
*   Scheduling expectation : Next Available with Dr. Francisca   *Length of Case:   *Clearance needed: no  *Anticoagulation Instructions: Hold all anticoagulants; does not take any  *Aspirin  Instructions: Hold Aspirin ; does not take any   *Post-op visit Date/Instructions:  1-3 day cath removal  *Diagnosis: BPH with retention   *Procedure:  HOLEP (47350)   Additional orders: N/A  -Admit type: OUTpatient  -Anesthesia: General  -VTE Prophylaxis Standing Order SCD's       Other:   -Standing Lab Orders Per Anesthesia    Lab other: UA&Urine Culture  -Standing Test orders EKG/Chest x-ray per Anesthesia       Test other:   - Medications:  Ancef 2gm IV  -Other orders:  N/A

## 2025-01-10 NOTE — Progress Notes (Signed)
 Simple Catheter Placement  Due to urinary retention patient is present today for a foley cath placement.  Patient was cleaned and prepped in a sterile fashion with betadine and 2% lidocaine  jelly was instilled into the urethra. A 16 FR coude foley catheter was inserted, urine return was noted  500 ml, urine was yellow in color.  The balloon was filled with 10cc of sterile water .  A overnight bag was attached for drainage.  Patient was given instruction on proper catheter care.  Patient tolerated well, no complications were noted   Performed by: Mathew Pinal, RN  Additional notes/ Follow up: PA to see after catheter placement

## 2025-01-11 ENCOUNTER — Ambulatory Visit: Admitting: Urology

## 2025-01-13 ENCOUNTER — Ambulatory Visit: Admitting: Urology

## 2025-01-13 VITALS — BP 149/85 | HR 57 | Ht 62.0 in | Wt 122.0 lb

## 2025-01-13 DIAGNOSIS — N401 Enlarged prostate with lower urinary tract symptoms: Secondary | ICD-10-CM | POA: Diagnosis not present

## 2025-01-13 DIAGNOSIS — N138 Other obstructive and reflux uropathy: Secondary | ICD-10-CM | POA: Diagnosis not present

## 2025-01-13 NOTE — Patient Instructions (Signed)

## 2025-01-13 NOTE — Progress Notes (Signed)
" ° °  01/13/2025 10:11 AM   Debby Silvan January 01, 1946 [id number]  Reason for visit: Urinary retention, elevated PSA, discussed surgical options  History: Recently admitted December 2025 for urinary retention with bilateral hydronephrosis and renal failure.  Foley placed.  Failed voiding trial.  Did not tolerate alpha-blocker secondary to hypotension History of mildly elevated PSA that downtrended on repeat, 4K score showed only 17% chance of prostate cancer, patient ultimately deferred biopsy Interested in definitive treatment options  Physical Exam: BP (!) 149/85   Imaging/labs: I personally viewed and interpreted the CT abdomen pelvis from December 2025 showing a 90 g prostate with distended bladder and bilateral hydroureteronephrosis down to the bladder  Today: Interested in options to resume spontaneous voiding Denies any flank pain, fevers, or chills  Plan:   BPH with retention: Discussed options including chronic Foley, intermittent catheterization, prostate artery embolization, or HoLEP.  Recommended HOLEP is the most definitive treatment with highest chance of resuming spontaneous voiding.  We also discussed possibility of undetected prostate cancer that could require additional treatments in the future. We discussed the risks and benefits of HoLEP at length.  The procedure requires general anesthesia and takes 1 to 2 hours, and a holmium laser is used to enucleate the prostate and push this tissue into the bladder.  A morcellator is then used to remove this tissue, which is sent for pathology.  The vast majority(>95%) of patients are able to discharge the same day with a catheter in place for 2 to 3 days, and will follow-up in clinic for a voiding trial.  We specifically discussed the risks of bleeding, infection, retrograde ejaculation, temporary urgency and urge incontinence, very low risk of long-term incontinence, urethral stricture/bladder neck contracture, pathologic evaluation of  prostate tissue and possible detection of prostate cancer or other malignancy, and possible need for additional procedures. Schedule HOLEP(90g)   Redell JAYSON Burnet, MD  [facility] [address], [address] [place], [place] [address] [phone]  "

## 2025-01-16 ENCOUNTER — Telehealth: Payer: Self-pay

## 2025-01-16 NOTE — Progress Notes (Signed)
" ° °  [facility] Surgical Posting Form  Surgery Date: Date: 02/13/2025  Surgeon: Dr. Redell Burnet, MD  Inpt ( No  )   Outpt (Yes)   Obs ( No  )   Diagnosis: N40.1, N13.8 Benign Prostatic Hyperplasia with Urinary Obstruction  -CPT: 47350  Surgery: Holmium Laser Enucleation of the Prostate  Stop Anticoagulations: Yes and also hold ASA  Cardiac/Medical/Pulmonary Clearance needed: no  *Orders entered into EPIC  Date: 01/16/25   *Case booked in EPIC  Date: 01/16/25  *Notified pt of Surgery: Date: 01/16/25  PRE-OP UA & CX: yes, will obtain in clinic on 01/30/2025  *Placed into Prior Authorization Work [place] Date: 01/16/25  Assistant/laser/rep:No                "

## 2025-01-16 NOTE — Addendum Note (Signed)
 Addended by: RUTHER SETTER A on: 01/16/2025 03:46 PM   Modules accepted: Orders

## 2025-01-16 NOTE — Telephone Encounter (Signed)
 Per Dr. Francisca, Patient is to be scheduled for Holmium Laser Enucleation of the Prostate   Mr. Duval and his Daughter were contacted and possible surgical dates were discussed, Monday February 16th, 2026 was agreed upon for surgery.   Patient was instructed that Dr. Francisca will require them to provide a pre-op UA & CX prior to surgery. This was ordered and scheduled drop off appointment was made for 01/30/2025.    Patient was directed to call [phone] between 1-3pm the day before surgery to find out surgical arrival time.  Instructions were given not to eat or drink from midnight on the night before surgery and have a driver for the day of surgery. On the surgery day patient was instructed to enter through the [facility] entrance of [facility] report the Same Day Surgery desk.   Pre-Admit Testing will be in contact via phone to set up an interview with the anesthesia team to review your history and medications prior to surgery.   Reminder of this information was sent via Mail to the patient.

## 2025-01-27 ENCOUNTER — Ambulatory Visit: Admitting: Urology

## 2025-01-27 ENCOUNTER — Encounter: Payer: Self-pay | Admitting: Urology

## 2025-01-27 DIAGNOSIS — N401 Enlarged prostate with lower urinary tract symptoms: Secondary | ICD-10-CM | POA: Diagnosis not present

## 2025-01-27 DIAGNOSIS — N138 Other obstructive and reflux uropathy: Secondary | ICD-10-CM | POA: Diagnosis not present

## 2025-01-27 LAB — URINALYSIS, COMPLETE
Bilirubin, UA: NEGATIVE
Glucose, UA: NEGATIVE
Ketones, UA: NEGATIVE
Nitrite, UA: NEGATIVE
Protein,UA: NEGATIVE
Specific Gravity, UA: 1.02 (ref 1.005–1.030)
Urobilinogen, Ur: 0.2 mg/dL (ref 0.2–1.0)
pH, UA: 5 (ref 5.0–7.5)

## 2025-01-27 LAB — MICROSCOPIC EXAMINATION

## 2025-01-27 NOTE — Progress Notes (Signed)
 Cath Change/ Replacement  Patient is present today for a catheter change due to urinary retention.  8 ml of water  was removed from the balloon, a 16 FR Coude foley cath was removed without difficulty.  Patient was cleaned and prepped in a sterile fashion with betadine and 2% lidocaine  jelly was instilled into the urethra. A 16 FR coude foley cath was replaced into the bladder, no complications were noted. Urine return was noted 100 ml and urine was yellow in color. The balloon was filled with 10ml of sterile water . A leg bag was attached for drainage.  Patient was given proper instruction on catheter care.    Performed by: CLOTILDA CORNWALL, PA-C and Laymon Ned, CMA    Follow up: A urine is collected for UA and UCX.  He is scheduled for HoLEP on 02/16.

## 2025-01-30 ENCOUNTER — Ambulatory Visit: Admitting: Urology

## 2025-01-30 DIAGNOSIS — N400 Enlarged prostate without lower urinary tract symptoms: Secondary | ICD-10-CM | POA: Insufficient documentation

## 2025-01-30 DIAGNOSIS — N401 Enlarged prostate with lower urinary tract symptoms: Secondary | ICD-10-CM

## 2025-01-30 DIAGNOSIS — Z2821 Immunization not carried out because of patient refusal: Secondary | ICD-10-CM | POA: Insufficient documentation

## 2025-01-30 DIAGNOSIS — R630 Anorexia: Secondary | ICD-10-CM | POA: Insufficient documentation

## 2025-01-30 DIAGNOSIS — M1611 Unilateral primary osteoarthritis, right hip: Secondary | ICD-10-CM | POA: Insufficient documentation

## 2025-01-30 DIAGNOSIS — R972 Elevated prostate specific antigen [PSA]: Secondary | ICD-10-CM | POA: Insufficient documentation

## 2025-01-30 DIAGNOSIS — Z68.41 Body mass index (BMI) pediatric, 5th percentile to less than 85th percentile for age: Secondary | ICD-10-CM | POA: Insufficient documentation

## 2025-01-30 DIAGNOSIS — E559 Vitamin D deficiency, unspecified: Secondary | ICD-10-CM | POA: Insufficient documentation

## 2025-01-30 DIAGNOSIS — R7303 Prediabetes: Secondary | ICD-10-CM | POA: Insufficient documentation

## 2025-01-30 DIAGNOSIS — F17200 Nicotine dependence, unspecified, uncomplicated: Secondary | ICD-10-CM | POA: Insufficient documentation

## 2025-01-30 DIAGNOSIS — Z720 Tobacco use: Secondary | ICD-10-CM | POA: Insufficient documentation

## 2025-02-01 NOTE — Progress Notes (Unsigned)
 "   02/01/2025 Albert Villarreal [id number] April 24, 1946  Gastroenterology Office Note    Referring Provider: [name] [name] Primary Care Physician:  [name] ONEIDA, PA-C  Primary GI Provider: Celestia Rima, NP; Theophilus Aloysius Fines, MD    Chief Complaint   No chief complaint on file.    History of Present Illness   Albert Villarreal is a 79 y.o. male with PMHX of *** , presenting today for hospital follow-up.  12/16/2024 colonoscopy - The examined portion of the ileum was normal. - Blood in the rectum, in the recto-sigmoid colon, in the sigmoid colon, in the descending colon, at the splenic flexure and in the transverse colon.  - Diverticulosis in the sigmoid colon.  - No specimens collected. - More blood on the left then the right and not seen in the terminal ileum. This is most consistent with a diverticular bleed.  12/15/2024 EGD - Normal esophagus.  - Gastritis. - Duodenitis. - Non-bleeding duodenal diverticulum. - No specimens collected  Patient admitted to the hospital on 12/14/2024 for generalized weakness, lower abdominal pain, blood in stool. Hemoglobin 12.9, then significantly anemic at 8.5.,  Past Medical History:  Diagnosis Date   Arthritis    Arthritis of right hip 01/30/2025   Benign prostatic hyperplasia 01/30/2025   Body mass index (BMI) of 5th to 84th percentile for age in childhood 01/30/2025   Chronic kidney disease    Chronic kidney disease, stage 3 (HCC) 06/15/2015   Claudication of right lower extremity    Current smoker 01/30/2025   Elevated PSA 01/30/2025   HTN (hypertension) 06/15/2015   Hypertension    Kidney stones    Loss of appetite 01/30/2025   PAD (peripheral artery disease) 08/24/2015   Pneumococcal vaccination declined 01/30/2025   Prediabetes 01/30/2025   Procedure not carried out because of patient's decision 04/22/2016   Tobacco use 01/30/2025   Vitamin D deficiency 01/30/2025    Past Surgical History:   Procedure Laterality Date   COLONOSCOPY N/A 12/16/2024   Procedure: COLONOSCOPY;  Surgeon: Jinny Carmine, MD;  Location[facility] END[facility]COPY;  Service: Endoscopy;  Laterality: N/A;   ESOPHAGOGASTRODUODENOSCOPY N/A 12/15/2024   Procedure: EGD (ESOPHAGOGASTRODUODENOSCOPY);  Surgeon: Jinny Carmine, MD;  Location: [facility] ENDOSCOPY;  Service: Endoscopy;  Laterality: N/A;   NO PAST SURGERIES      Current Outpatient Medications  Medication Sig Dispense Refill   pantoprazole  (PROTONIX ) 40 MG tablet Take 1 tablet (40 mg total) by mouth 2 (two) times daily. 30 tablet 3   rosuvastatin (CRESTOR) 10 MG tablet Take 10 mg by mouth daily.     tamsulosin (FLOMAX) 0.4 MG CAPS capsule Take 0.4 mg by mouth daily.     No current facility-administered medications for this visit.    Allergies as of 02/02/2025   (No Known Allergies)    Family History  Problem Relation Age of Onset   Hypertension Mother    Hypertension Father    Prostate cancer Neg Hx    Bladder Cancer Neg Hx    Kidney cancer Neg Hx    Kidney disease Neg Hx     Social History   Socioeconomic History   Marital status: Single    Spouse name: Not on file   Number of children: Not on file   Years of education: Not on file   Highest education level: Not on file  Occupational History   Not on file  Tobacco Use   Smoking status: Former    Current packs/day: 0.00    Types:  Cigarettes    Quit date: 12/29/2012    Years since quitting: 12.1   Smokeless tobacco: Never  Vaping Use   Vaping status: Never Used  Substance and Sexual Activity   Alcohol use: No    Alcohol/week: 0.0 standard drinks of alcohol   Drug use: No   Sexual activity: Not on file  Other Topics Concern   Not on file  Social History Narrative   Not on file   Social Drivers of Health   Tobacco Use: Medium Risk (01/27/2025)   Patient History    Smoking Tobacco Use: Former    Smokeless Tobacco Use: Never    Passive Exposure: Not on file  Financial Resource Strain:  Not on file  Food Insecurity: Patient Declined (12/17/2024)   Epic    Worried About Radiation Protection Practitioner of Food in the Last Year: Patient declined    Barista in the Last Year: Patient declined  Transportation Needs: Patient Declined (12/17/2024)   Epic    Lack of Transportation (Medical): Patient declined    Lack of Transportation (Non-Medical): Patient declined  Physical Activity: Not on file  Stress: Not on file  Social Connections: Patient Declined (12/17/2024)   Social Connection and Isolation Panel    Frequency of Communication with Friends and Family: Patient declined    Frequency of Social Gatherings with Friends and Family: Patient declined    Attends Religious Services: Patient declined    Active Member of Clubs or Organizations: Patient declined    Attends Banker Meetings: Patient declined    Marital Status: Patient declined  Intimate Partner Violence: Patient Declined (12/17/2024)   Epic    Fear of Current or Ex-Partner: Patient declined    Emotionally Abused: Patient declined    Physically Abused: Patient declined    Sexually Abused: Patient declined  Depression (PHQ2-9): Not on file  Alcohol Screen: Not on file  Housing: Patient Declined (12/17/2024)   Epic    Unable to Pay for Housing in the Last Year: Patient declined    Number of Times Moved in the Last Year: Not on file    Homeless in the Last Year: Patient declined  Utilities: Patient Declined (12/17/2024)   Epic    Threatened with loss of utilities: Patient declined  Health Literacy: Not on file     RELEVANT GI HISTORY, IMAGING AND LABS: CBC    Component Value Date/Time   WBC 10.0 12/18/2024 0436   RBC 3.62 (L) 12/18/2024 0436   HGB 11.5 (L) 12/18/2024 0436   HGB 14.4 03/01/2014 1100   HCT 34.6 (L) 12/18/2024 0436   HCT 43.7 03/01/2014 1100   PLT 283 12/18/2024 0436   PLT 272 03/01/2014 1100   MCV 95.6 12/18/2024 0436   MCV 93 03/01/2014 1100   MCH 31.8 12/18/2024 0436   MCHC 33.2  12/18/2024 0436   RDW 13.7 12/18/2024 0436   RDW 14.6 (H) 03/01/2014 1100   LYMPHSABS 3.2 12/18/2024 0436   LYMPHSABS 2.7 03/01/2014 1100   MONOABS 0.8 12/18/2024 0436   MONOABS 0.6 03/01/2014 1100   EOSABS 0.4 12/18/2024 0436   EOSABS 0.2 03/01/2014 1100   BASOSABS 0.1 12/18/2024 0436   BASOSABS 0.2 (H) 03/01/2014 1100   Recent Labs    12/14/24 1918 12/15/24 0107 12/15/24 0509 12/15/24 1859 12/17/24 0354 12/18/24 0436  HGB 12.9* 8.5* 8.8* 10.4* 10.9* 11.5*    CMP     Component Value Date/Time   NA 142 12/18/2024 0436   NA 143 09/13/2015  1114   NA 134 (L) 01/15/2014 0531   K 4.8 12/18/2024 0436   K 3.8 01/15/2014 0531   CL 106 12/18/2024 0436   CL 104 01/15/2014 0531   CO2 24 12/18/2024 0436   CO2 26 01/15/2014 0531   GLUCOSE 76 12/18/2024 0436   GLUCOSE 112 (H) 01/15/2014 0531   BUN 22 12/18/2024 0436   BUN 9 09/13/2015 1114   BUN 17 01/15/2014 0531   CREATININE 1.71 (H) 12/18/2024 0436   CREATININE 1.87 (H) 01/15/2014 0531   CALCIUM  8.9 12/18/2024 0436   CALCIUM  8.4 (L) 01/15/2014 0531   PROT 6.5 12/14/2024 1918   PROT 6.3 (L) 02/16/2013 0030   ALBUMIN 3.4 (L) 12/17/2024 0354   ALBUMIN 3.1 (L) 02/16/2013 0030   AST 17 12/14/2024 1918   AST 23 02/16/2013 0030   ALT 15 12/14/2024 1918   ALT 18 02/16/2013 0030   ALKPHOS 45 12/14/2024 1918   ALKPHOS 44 (L) 02/16/2013 0030   BILITOT 0.2 12/14/2024 1918   BILITOT 0.9 02/16/2013 0030   GFRNONAA 41 (L) 12/18/2024 0436   GFRNONAA 37 (L) 01/15/2014 0531   GFRAA 73 09/13/2015 1114   GFRAA 42 (L) 01/15/2014 0531      Latest Ref Rng & Units 12/17/2024    3:54 AM 12/14/2024    7:18 PM 08/24/2015   11:55 AM  Hepatic Function  Total Protein 6.5 - 8.1 g/dL  6.5  7.7   Albumin 3.5 - 5.0 g/dL 3.4  3.6  4.1   AST 15 - 41 U/L  17  20   ALT 0 - 44 U/L  15  17   Alk Phosphatase 38 - 126 U/L  45  51   Total Bilirubin 0.0 - 1.2 mg/dL  0.2  0.5   Bilirubin, Direct 0.1 - 0.5 mg/dL   <9.8       Review of Systems    All systems reviewed and negative except where noted in HPI.    Physical Exam  There were no vitals taken for this visit. No LMP for male patient. General:   Alert and oriented. Pleasant and cooperative. Well-nourished and well-developed. In no acute distress.  Head:  Normocephalic and atraumatic. Eyes:  Without icterus Ears:  Normal auditory acuity. Neck:  Supple; no masses or thyromegaly. Lungs:  Respirations even and unlabored.  Clear throughout to auscultation.   No wheezes, crackles, or rhonchi. No acute distress. Heart:  Regular rate and rhythm; no murmurs, clicks, rubs, or gallops. Abdomen:  Normal bowel sounds.  No bruits.  Soft, non-tender and non-distended without masses, hepatosplenomegaly or hernias noted.  No guarding or rebound tenderness.  ***Negative Carnett sign.   Rectal:  Deferred.***  Msk:  Symmetrical without gross deformities. Normal posture. Extremities:  Without edema. Neurologic:  Alert and  oriented x4;  grossly normal neurologically. Skin:  Intact without significant lesions or rashes. Psych:  Alert and cooperative. Normal mood and affect.   Assessment & Plan   Collie Wernick is a 79 y.o. male presenting today with    I discussed the assessment and treatment plan with the patient. The patient was provided an opportunity to ask questions and all were answered. The patient agreed with the plan and demonstrated an understanding of the instructions.   The patient was advised to call back or seek an in-person evaluation if the symptoms worsen or if the condition fails to improve as anticipated.  Grayce Bohr, DNP, AGNP-C [facility]  "

## 2025-02-02 ENCOUNTER — Ambulatory Visit: Admitting: Family Medicine

## 2025-02-03 ENCOUNTER — Ambulatory Visit: Payer: Self-pay | Admitting: Urology

## 2025-02-03 LAB — CULTURE, URINE COMPREHENSIVE

## 2025-02-06 ENCOUNTER — Inpatient Hospital Stay: Admission: RE | Admit: 2025-02-06 | Source: Ambulatory Visit

## 2025-02-09 ENCOUNTER — Ambulatory Visit

## 2025-02-13 ENCOUNTER — Ambulatory Visit: Admission: RE | Admit: 2025-02-13 | Admitting: Urology

## 2025-02-13 ENCOUNTER — Encounter: Admission: RE | Payer: Self-pay | Source: Home / Self Care

## 2025-02-13 SURGERY — ENUCLEATION, PROSTATE, USING LASER, WITH MORCELLATION
Anesthesia: General

## 2025-02-15 ENCOUNTER — Encounter: Admitting: Urology

## 2025-05-24 ENCOUNTER — Ambulatory Visit: Admitting: Urology
